# Patient Record
Sex: Female | Born: 1990 | Race: White | Hispanic: No | Marital: Single | Smoking: Current every day smoker
Health system: Southern US, Community
[De-identification: ages and names within clinical notes are randomized; demographics above are authoritative.]

## PROBLEM LIST (undated history)

## (undated) DIAGNOSIS — F319 Bipolar disorder, unspecified: Secondary | ICD-10-CM

---
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
## 2012-07-18 IMAGING — US ABDOMEN ULTRASOUND
1 series · 17 of 25 positions shown · non-contrast
Comparison: none

REASON FOR EXAM: RLQ/pelvic pain
COMMENTS:

PROCEDURE:     US  - US ABDOMEN GENERAL SURVEY  - June 04, 2011  [DATE]
RESULT:     Comparison: None.
TECHNIQUE: Multiple grayscale and color Doppler images were obtained of the
abdomen.

[Series 1: abdomen ultrasound · 17 of 84 slices shown]
[im 1/84]
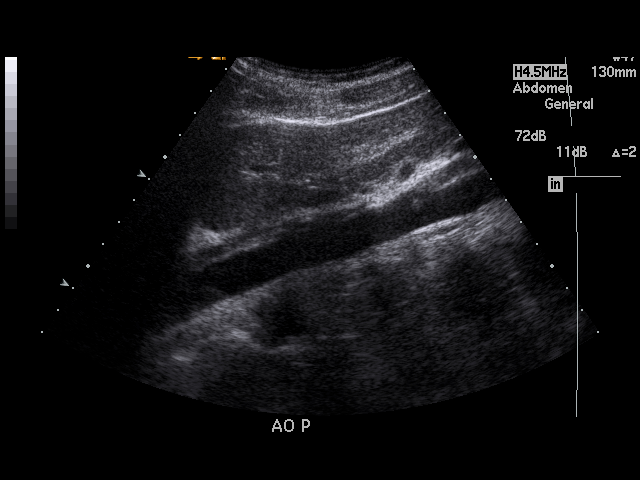
[im 7/84]
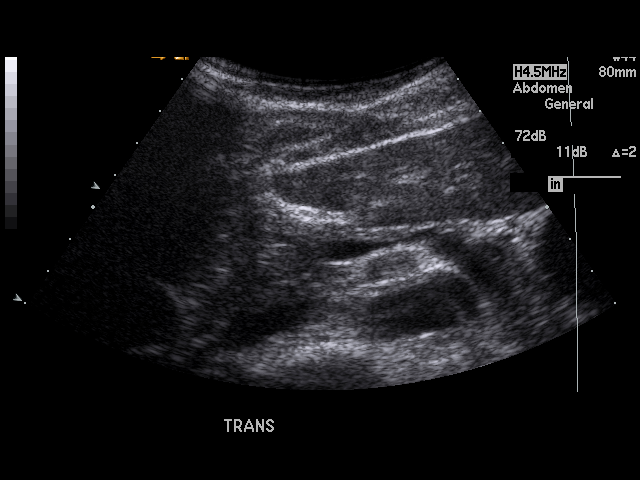
[im 11/84]
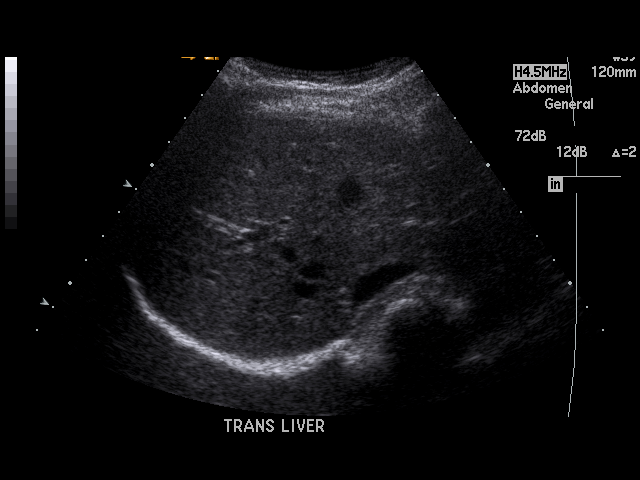
[im 18/84]
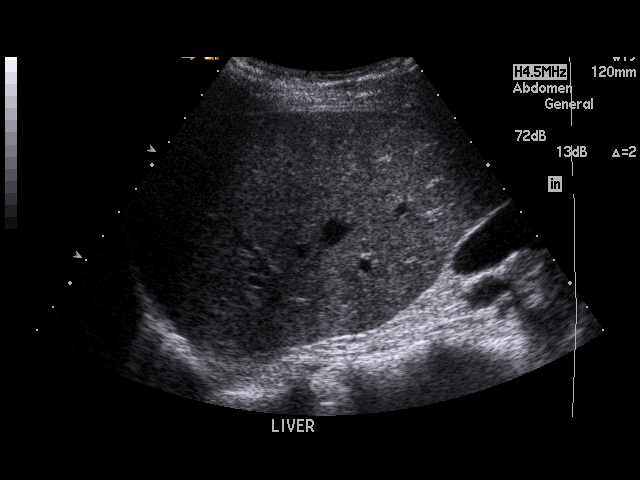
[im 21/84]
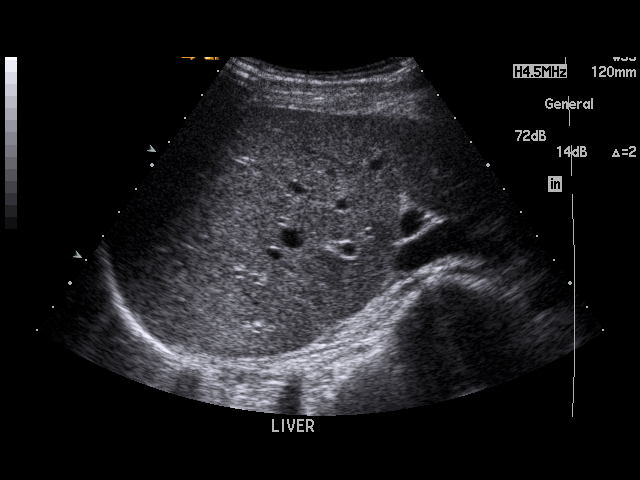
[im 28/84]
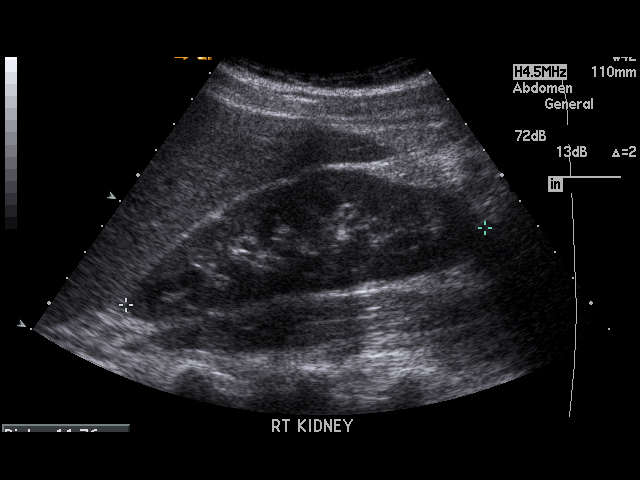
[im 32/84]
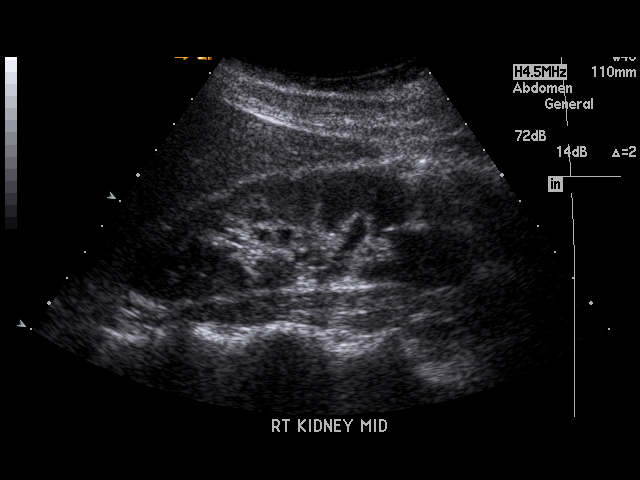
[im 39/84]
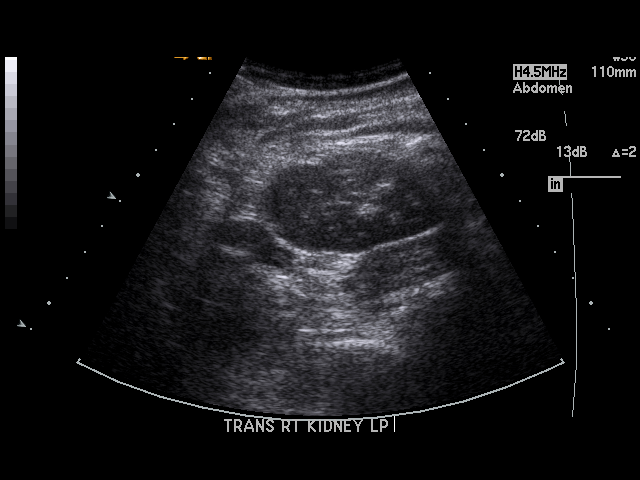
[im 42/84]
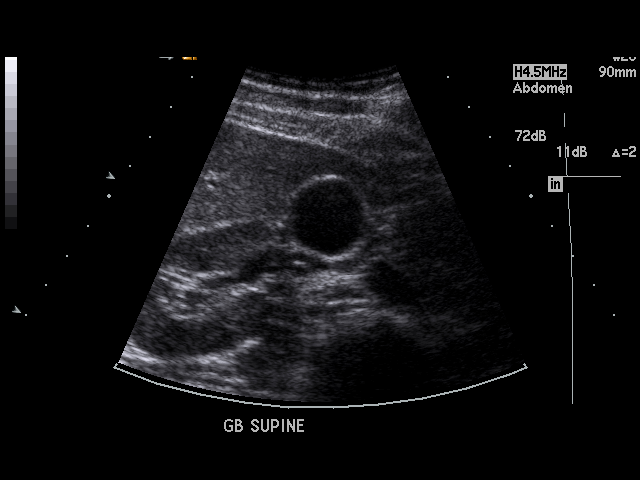
[im 45/84]
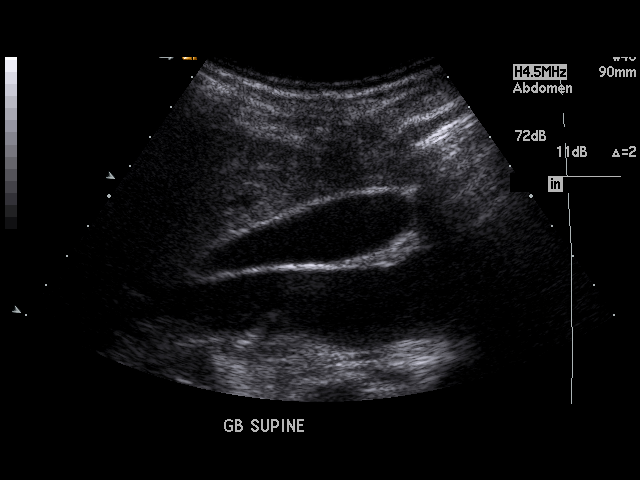
[im 52/84]
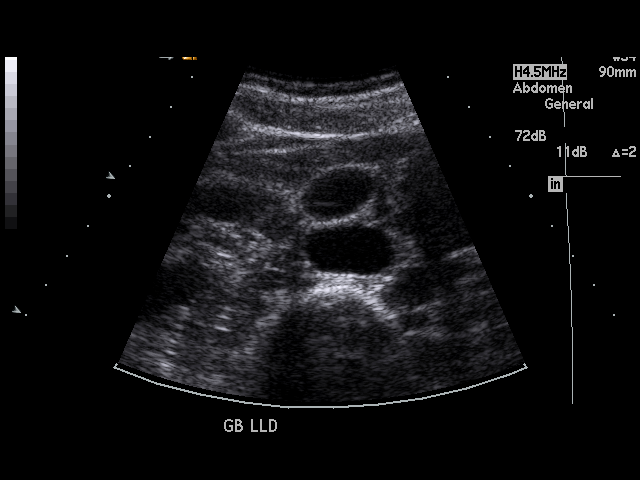
[im 56/84]
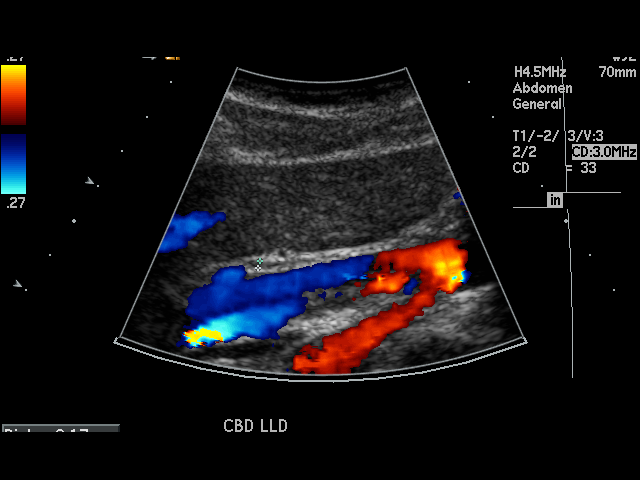
[im 63/84]
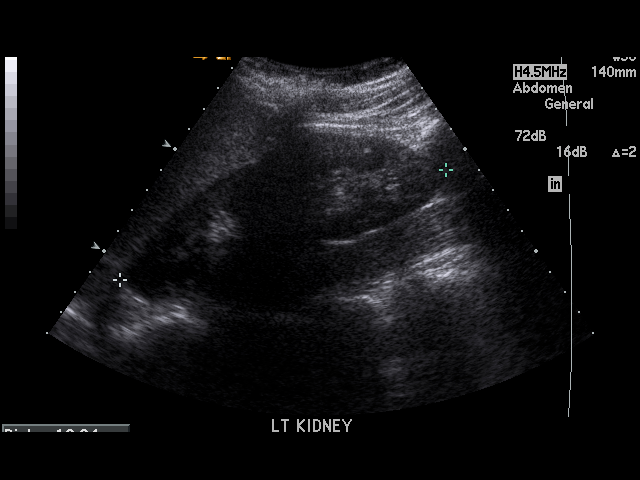
[im 66/84]
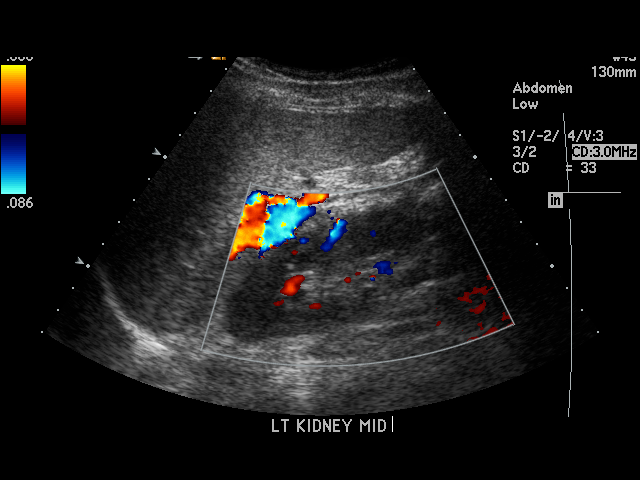
[im 73/84]
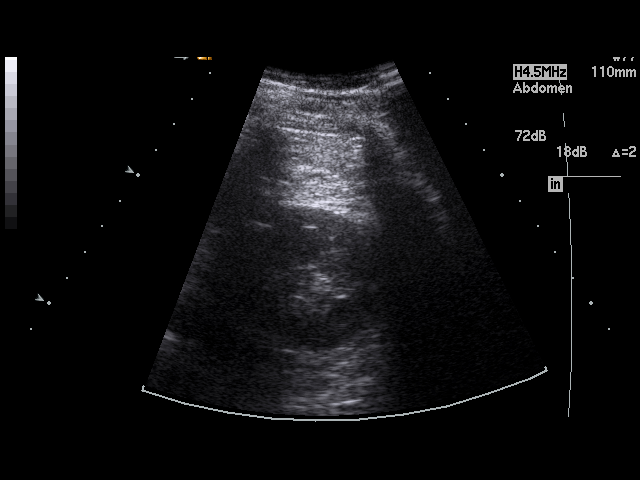
[im 77/84]
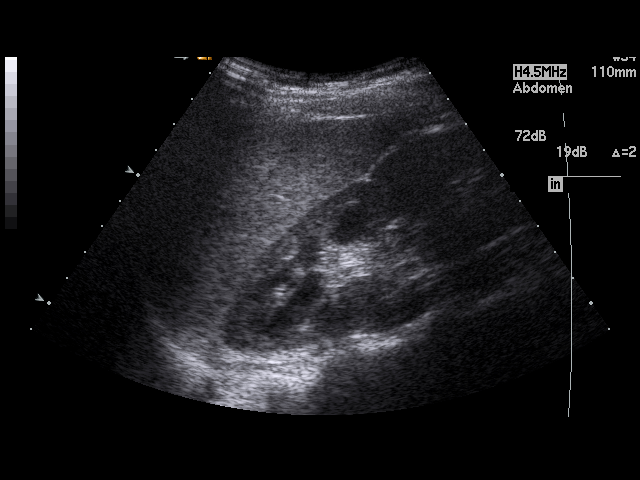
[im 84/84]
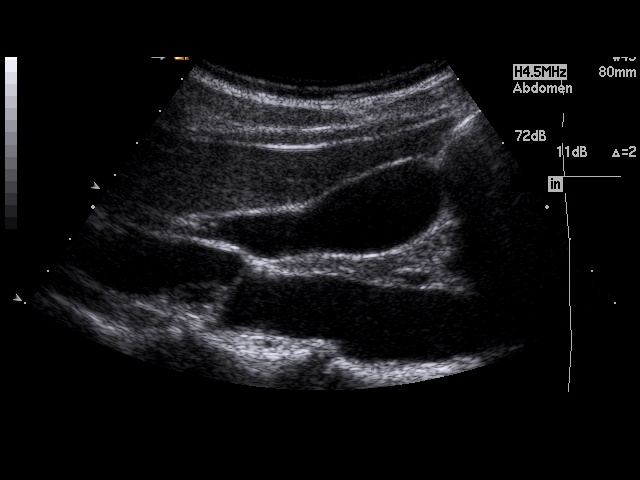

[17 of 25 positions shown; findings below may reference images not displayed]

FINDINGS: The visualized liver, pancreas, and spleen are unremarkable. The gallbladder
is normal. Sonographic Murphy sign was negative. The common bile duct
measures 2 mm.

Images of the kidneys showed no hydronephrosis.
IMPRESSION: No acute findings. Normal gallbladder.

## 2016-04-08 ENCOUNTER — Encounter (HOSPITAL_COMMUNITY): Payer: Self-pay

## 2016-04-08 ENCOUNTER — Emergency Department (HOSPITAL_COMMUNITY)
Admission: EM | Admit: 2016-04-08 | Discharge: 2016-04-09 | Disposition: A | Discharge status: Other Acute Inpatient Hospital | Payer: BC Managed Care – PPO | Attending: Emergency Medicine | Admitting: Emergency Medicine

## 2016-04-08 DIAGNOSIS — F17200 Nicotine dependence, unspecified, uncomplicated: Secondary | ICD-10-CM | POA: Insufficient documentation

## 2016-04-08 DIAGNOSIS — R45851 Suicidal ideations: Secondary | ICD-10-CM

## 2016-04-08 DIAGNOSIS — F10230 Alcohol dependence with withdrawal, uncomplicated: Secondary | ICD-10-CM | POA: Diagnosis not present

## 2016-04-08 DIAGNOSIS — F314 Bipolar disorder, current episode depressed, severe, without psychotic features: Secondary | ICD-10-CM | POA: Diagnosis present

## 2016-04-08 DIAGNOSIS — F315 Bipolar disorder, current episode depressed, severe, with psychotic features: Secondary | ICD-10-CM | POA: Diagnosis not present

## 2016-04-08 DIAGNOSIS — Z79899 Other long term (current) drug therapy: Secondary | ICD-10-CM | POA: Diagnosis not present

## 2016-04-08 DIAGNOSIS — T391X2A Poisoning by 4-Aminophenol derivatives, intentional self-harm, initial encounter: Secondary | ICD-10-CM | POA: Insufficient documentation

## 2016-04-08 HISTORY — DX: Bipolar disorder, unspecified: F31.9

## 2016-04-08 LAB — COMPREHENSIVE METABOLIC PANEL
ALBUMIN: 3.8 g/dL (ref 3.5–5.0)
ALT: 15 U/L (ref 14–54)
ALT: 17 U/L (ref 14–54)
AST: 16 U/L (ref 15–41)
AST: 19 U/L (ref 15–41)
Albumin: 3.8 g/dL (ref 3.5–5.0)
Alkaline Phosphatase: 26 U/L — ABNORMAL LOW (ref 38–126)
Alkaline Phosphatase: 24 U/L — ABNORMAL LOW (ref 38–126)
Anion gap: 10 (ref 5–15)
Anion gap: 11 (ref 5–15)
BUN: 11 mg/dL (ref 6–20)
BUN: 6 mg/dL (ref 6–20)
CHLORIDE: 111 mmol/L (ref 101–111)
CHLORIDE: 108 mmol/L (ref 101–111)
CO2: 17 mmol/L — AB (ref 22–32)
CO2: 20 mmol/L — AB (ref 22–32)
CREATININE: 0.8 mg/dL (ref 0.44–1.00)
Calcium: 8.3 mg/dL — ABNORMAL LOW (ref 8.9–10.3)
Calcium: 7.7 mg/dL — ABNORMAL LOW (ref 8.9–10.3)
Creatinine, Ser: 0.74 mg/dL (ref 0.44–1.00)
GFR calc Af Amer: >60 mL/min (ref >60)
GFR calc non Af Amer: >60 mL/min (ref >60)
GLUCOSE: 117 mg/dL — AB (ref 65–99)
Glucose, Bld: 114 mg/dL — ABNORMAL HIGH (ref 65–99)
POTASSIUM: 3.1 mmol/L — AB (ref 3.5–5.1)
POTASSIUM: 3.7 mmol/L (ref 3.5–5.1)
SODIUM: 138 mmol/L (ref 135–145)
Sodium: 139 mmol/L (ref 135–145)
Total Bilirubin: 0.3 mg/dL (ref 0.3–1.2)
Total Bilirubin: 0.4 mg/dL (ref 0.3–1.2)
Total Protein: 6.9 g/dL (ref 6.5–8.1)
Total Protein: 7.5 g/dL (ref 6.5–8.1)

## 2016-04-08 LAB — ACETAMINOPHEN LEVEL
Acetaminophen (Tylenol), Serum: 36 ug/mL — ABNORMAL HIGH (ref 10–30)
Acetaminophen (Tylenol), Serum: <10 ug/mL — ABNORMAL LOW (ref 10–30)

## 2016-04-08 LAB — CBC
HEMATOCRIT: 33 % — AB (ref 36.0–46.0)
HEMOGLOBIN: 10.9 g/dL — AB (ref 12.0–15.0)
MCH: 31 pg (ref 26.0–34.0)
MCHC: 33 g/dL (ref 30.0–36.0)
MCV: 93.8 fL (ref 78.0–100.0)
Platelets: 326 10*3/uL (ref 150–400)
RBC: 3.52 MIL/uL — AB (ref 3.87–5.11)
RDW: 13.2 % (ref 11.5–15.5)
WBC: 7.8 10*3/uL (ref 4.0–10.5)

## 2016-04-08 LAB — POC URINE PREG, ED: PREG TEST UR: NEGATIVE

## 2016-04-08 LAB — RAPID URINE DRUG SCREEN, HOSP PERFORMED
AMPHETAMINES: NOT DETECTED
BARBITURATES: NOT DETECTED
BENZODIAZEPINES: NOT DETECTED
COCAINE: NOT DETECTED
Opiates: NOT DETECTED
TETRAHYDROCANNABINOL: NOT DETECTED

## 2016-04-08 LAB — ETHANOL: ALCOHOL ETHYL (B): 95 mg/dL — AB (ref <5)

## 2016-04-08 LAB — SALICYLATE LEVEL

## 2016-04-08 MED ORDER — SODIUM CHLORIDE 0.9 % IV BOLUS (SEPSIS)
1000.0000 mL | Freq: Once | INTRAVENOUS | Status: AC
  Administered 2016-04-08: 1000 mL via INTRAVENOUS

## 2016-04-08 MED ORDER — HYDROXYZINE HCL 25 MG PO TABS
25.0000 mg | ORAL_TABLET | Freq: Three times a day (TID) | ORAL | Status: DC | PRN
  Administered 2016-04-08 – 2016-04-09 (×2): 25 mg via ORAL
  Filled 2016-04-08 (×2): qty 1

## 2016-04-08 MED ORDER — TRAZODONE HCL 50 MG PO TABS
50.0000 mg | ORAL_TABLET | Freq: Every evening | ORAL | Status: DC | PRN
  Administered 2016-04-08: 50 mg via ORAL
  Filled 2016-04-08: qty 1

## 2016-04-08 MED ORDER — DEXTROSE 5 % IV SOLN
15.0000 mg/kg/h | INTRAVENOUS | Status: DC
  Administered 2016-04-08: 15 mg/kg/h via INTRAVENOUS
  Filled 2016-04-08: qty 200

## 2016-04-08 MED ORDER — ONDANSETRON HCL 4 MG/2ML IJ SOLN
4.0000 mg | Freq: Once | INTRAMUSCULAR | Status: AC
  Administered 2016-04-08: 4 mg via INTRAVENOUS
  Filled 2016-04-08: qty 2

## 2016-04-08 MED ORDER — ACETYLCYSTEINE LOAD VIA INFUSION
150.0000 mg/kg | Freq: Once | INTRAVENOUS | Status: AC
  Administered 2016-04-08: 10005 mg via INTRAVENOUS
  Filled 2016-04-08: qty 251

## 2016-04-08 NOTE — ED Notes (Signed)
Bed: RESB Expected date:  Expected time:  Means of arrival:  Comments: EMS SI/intoxicated

## 2016-04-08 NOTE — ED Notes (Signed)
Patient noted in room. Medication order obtained for previous c/o. Pt. stable, in no acute distress. Q15 minute rounds and monitoring via [employer] to continue.

## 2016-04-08 NOTE — ED Provider Notes (Signed)
I assumed care of this patient from Dr. Rhunette CroftNanavati at 0700.  Please see their note for further details of Hx, PE.  Briefly patient is a 25 y.o. female with a IVC; Suicidal; and Alcohol Intoxication  that is concerning for possible acetaminophen toxicity. Patient was started on NAC due to mildly elevated acetaminophen level.  Current plan is to follow-up repeat acetaminophen level and CMP. These are within normal limits NAC can be discontinued.  Repeat acetaminophen level negative and CMP without evidence of renal failure. Patient was reassessed and clinically sober. Medically cleared for behavioral health evaluation.     Veronica ConnPedro Eduardo Norm Wray, MD 04/08/16 [phone]

## 2016-04-08 NOTE — ED Notes (Signed)
Patient noted sleeping in room. No complaints, stable, in no acute distress. Q15 minute rounds and monitoring via [employer] Cam[employer] to continue.  

## 2016-04-08 NOTE — ED Notes (Signed)
Pt's mom says that she has attempted suicide three other times, she does have superficial lacerations to her wrists and she has stated that she doesn't want to live and having dark thoughts to her mom and friend.

## 2016-04-08 NOTE — ED Notes (Signed)
Patient noted in room. C/O anxiety and insomnia. Pt. stable, in no acute distress. Q15 minute rounds and monitoring via [employer] to continue. Snack and beverage given.

## 2016-04-08 NOTE — ED Notes (Signed)
Pt oriented to room and unit.  Pt is very tearful and withdrawn.  Pt contracts for safety.  15 minute checks and video monitoring in place.

## 2016-04-08 NOTE — ED Notes (Signed)
Bed: WA15 Expected date:  Expected time:  Means of arrival:  Comments: Res b 

## 2016-04-08 NOTE — BH Assessment (Signed)
[facility] Assessment Progress Note   Case was staffed with Lord DNP who recommended an inpatient admission as appropriate bed placement is investigated.     

## 2016-04-08 NOTE — ED Notes (Signed)
Pt has been drinking heavily tonight and per mom on scene only took one of her lamictal pills, pt is bipolar but doesn't have a doctor but is suppose to be getting a new doctor soon. There is also some boyfriend drama. Mom is taking out IVC papers, pt admitted to [employer] that she was suicidal.

## 2016-04-08 NOTE — Progress Notes (Signed)
MEDICATION RELATED CONSULT NOTE - INITIAL   Pharmacy Consult for Acetadote Indication: Nyquil and Lamictal OD  Allergies  Allergen Reactions  . Dimetapp Cold-Allergy [Brompheniramine-Phenylephrine] Other (See Comments)    Caused confusion    Patient Measurements: Height: 5\' 7"  (170.2 cm) Weight: 147 lb (66.7 kg) IBW/kg (Calculated) : 61.6   Vital Signs: Temp: 98.3 F (36.8 C) (11/17 0214) Temp Source: Oral (11/17 0214) BP: 134/86 (11/17 0235) Pulse Rate: 101 (11/17 0235) Intake/Output from previous day: No intake/output data recorded. Intake/Output from this shift: No intake/output data recorded.  Labs:  Recent Labs  04/08/16 0219  WBC 7.8  HGB 10.9*  HCT 33.0*  PLT 326  CREATININE 0.80  ALBUMIN 3.8  PROT 6.9  AST 16  ALT 15  ALKPHOS 26*  BILITOT 0.3   Estimated Creatinine Clearance: 106.4 mL/min (by C-G formula based on SCr of 0.8 mg/dL).   Microbiology: No results found for this or any previous visit (from the past 720 hour(s)).  Medical History: Past Medical History:  Diagnosis Date  . Bipolar 1 disorder (HCC)     Medications:  Scheduled:  . acetylcysteine  150 mg/kg Intravenous Once   Infusions:  . acetylcysteine    . sodium chloride      Assessment: 25 yoF drank 1/2 bottle of nyquil and took all of her lamictal tablets.  PC was contacted and they recommened checking apap level which was elevated at 36.   Acetadote per Rx. Today, 11/17 -AST/ALT=16/15, Apap=36  Goal of Therapy:  Prevent liver damage  Plan:  Acetadote 150 mg/kg x1 over 1 hr Then 15 mg/kg/hr until PC recommends stopping F/u Apap/ALT/AST/INR  Lorenza EvangelistGreen, Loretha Ure R 04/08/2016,4:33 AM

## 2016-04-08 NOTE — ED Notes (Signed)
Poison Control contact spoke with Onalee HuaDavid who made recommendations to obtain Acetaminophen level and treat as appropriate and to obtain a EKG.  Expect pt to have N/V/ Drowsiness. Slurred speech, tachycardia, and possibly SZ determine on amount ingested.

## 2016-04-08 NOTE — ED Notes (Signed)
[phone] Stanley- Lisa mother Call if TTS or MD has any questions
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
## 2016-04-08 NOTE — ED Provider Notes (Signed)
[facility]-EMERGENCY DEPT Provider Note   CSN: [id number] Arrival date & time: 04/08/16  9528  By signing my name below, I, Soijett Blue, attest that this documentation has been prepared under the direction and in the presence of Derwood Kaplan, MD. Electronically Signed: Soijett Blue, ED Scribe. 04/08/16. 3:50 AM.  History   Chief Complaint Chief Complaint  Patient presents with  . IVC  . Suicidal  . Alcohol Intoxication    LEVEL 5 CAVEAT: ALTERED MENTAL STATUS/SLEEPINESS  HPI Veronica Dawson is a 25 y.o. female with a PMHx of bipolar 1 disorder, who presents to the Emergency Department complaining of suicidal onset 11 PM yesterday night. Mother notes that she believed that the pt was attempting to commit suicide tonight due to the pt sending her mother a "goodbye" text tonight. Mother states that the pt has had recent stressors of financial debt and issues with her boyfriend. Mother notes that the pt drank half a bottle of nyquil along with taking all of her 100 mg lamictal. Pt takes lamictal and depakote for bipolar 1 disorder and the pt stopped taking depakote this past week without doctors orders. Mother denies the pt taking any other medications at this time. Mother denies the pt having any other symptoms at this time.   The history is provided by the EMS personnel and a parent. No language interpreter was used.    Past Medical History:  Diagnosis Date  . Bipolar 1 disorder (HCC)     There are no active problems to display for this patient.   History reviewed. No pertinent surgical history.  OB History    No data available       Home Medications    Prior to Admission medications   Medication Sig Start Date End Date Taking? Authorizing Provider  lamoTRIgine (LAMICTAL) 100 MG tablet Take 100 mg by mouth daily.   Yes Historical Provider, MD  Multiple Vitamin (MULTIVITAMIN WITH MINERALS) TABS tablet Take 1 tablet by mouth daily.   Yes Historical Provider, MD    norethindrone-ethinyl estradiol (JUNEL FE,GILDESS FE,LOESTRIN FE) 1-20 MG-MCG tablet Take 1 tablet by mouth daily.   Yes Historical Provider, MD    Family History History reviewed. No pertinent family history.  Social History Social History  Substance Use Topics  . Smoking status: Current Every Day Smoker  . Smokeless tobacco: Never Used  . Alcohol use Yes     Allergies   Dimetapp cold-allergy [brompheniramine-phenylephrine]   Review of Systems Review of Systems  Reason unable to perform ROS: altered mental status change/sleepiness.     Physical Exam Updated Vital Signs BP 129/74 (BP Location: Left Arm)   Pulse 78   Temp 98.4 F (36.9 C) (Oral)   Resp 18   Ht 5\' 7"  (1.702 m)   Wt 147 lb (66.7 kg)   SpO2 99%   BMI 23.02 kg/m   Physical Exam  Constitutional: She is oriented to person, place, and time. She appears well-developed and well-nourished. No distress.  HENT:  Head: Normocephalic and atraumatic.  Eyes: EOM are normal. Pupils are equal, round, and reactive to light. Right eye exhibits no nystagmus. Left eye exhibits no nystagmus.  Pupils are 3 mm and equal. PERRL.   Neck: Neck supple.  Cardiovascular: Regular rhythm and normal heart sounds.  Tachycardia present.  Exam reveals no gallop and no friction rub.   No murmur heard. Pulmonary/Chest: Effort normal and breath sounds normal. No respiratory distress. She has no wheezes. She has no rales.  Abdominal: Soft.  She exhibits no distension. There is no tenderness.  Musculoskeletal: Normal range of motion.  Neurological: She is alert and oriented to person, place, and time.  Skin: Skin is warm and dry.  Psychiatric: She has a normal mood and affect. Her behavior is normal.  Nursing note and vitals reviewed.    ED Treatments / Results  DIAGNOSTIC STUDIES: Oxygen Saturation is 97% on Ra, nl by my interpretation.    COORDINATION OF CARE: 3:45 AM Discussed treatment plan with pt at bedside which includes  labs, UDS, consult to TTS, and pt agreed to plan.   Labs (all labs ordered are listed, but only abnormal results are displayed) Labs Reviewed  COMPREHENSIVE METABOLIC PANEL - Abnormal; Notable for the following:       Result Value   Potassium 3.1 (*)    CO2 17 (*)    Glucose, Bld 114 (*)    Calcium 8.3 (*)    Alkaline Phosphatase 26 (*)    All other components within normal limits  ETHANOL - Abnormal; Notable for the following:    Alcohol, Ethyl (B) 95 (*)    All other components within normal limits  CBC - Abnormal; Notable for the following:    RBC 3.52 (*)    Hemoglobin 10.9 (*)    HCT 33.0 (*)    All other components within normal limits  ACETAMINOPHEN LEVEL - Abnormal; Notable for the following:    Acetaminophen (Tylenol), Serum 36 (*)    All other components within normal limits  ACETAMINOPHEN LEVEL - Abnormal; Notable for the following:    Acetaminophen (Tylenol), Serum <10 (*)    All other components within normal limits  COMPREHENSIVE METABOLIC PANEL - Abnormal; Notable for the following:    CO2 20 (*)    Glucose, Bld 117 (*)    Calcium 7.7 (*)    Alkaline Phosphatase 24 (*)    All other components within normal limits  RAPID URINE DRUG SCREEN, HOSP PERFORMED  SALICYLATE LEVEL  POC URINE PREG, ED    Procedures Procedures (including critical care time)  Medications Ordered in ED Medications  acetylcysteine (ACETADOTE) 40 mg/mL load via infusion 10,005 mg (10,005 mg Intravenous Bolus from Bag 04/08/16 0452)    Followed by  acetylcysteine (ACETADOTE) 40,000 mg in dextrose 5 % 1,000 mL (40 mg/mL) infusion (0 mg/kg/hr  66.7 kg Intravenous Stopped 04/08/16 1224)  hydrOXYzine (ATARAX/VISTARIL) tablet 25 mg (25 mg Oral Given 04/08/16 2120)  traZODone (DESYREL) tablet 50 mg (50 mg Oral Given 04/08/16 2120)  sodium chloride 0.9 % bolus 1,000 mL (0 mLs Intravenous Stopped 04/08/16 1112)  ondansetron (ZOFRAN) injection 4 mg (4 mg Intravenous Given 04/08/16 1224)      Initial Impression / Assessment and Plan / ED Course  I have reviewed the triage vital signs and the nursing notes.  Pertinent labs that were available during my care of the patient were reviewed by me and considered in my medical decision making (see chart for details).  Clinical Course     I personally performed the services described in this documentation, which was scribed in my presence. The recorded information has been reviewed and is accurate.   Final Clinical Impressions(s) / ED Diagnoses   Final diagnoses:  Suicidal ideation  Intentional acetaminophen overdose, initial encounter [facility](HCC)    New Prescriptions New Prescriptions   No medications on file   I personally performed the services described in this documentation, which was scribed in my presence. The recorded information has been reviewed and  is accurate.   Pt comes in with cc of SI. She overdosed on multiple meds and is intoxicated. Unknown time of Nyquil ingestion. Mother repots that she got a text message from daughter at 2711, and daughter had already overdosed at that time, so we presume the ingestion time to be 10 pm. Daughter not very helpful with history. Tylenol level is high - we will start Nacetyl cysteine. Repeat level ordered for 10 am, if improving, the mucomyst can be discontinued, and pt can be medically cleared.  CRITICAL CARE Performed by: Derwood KaplanNanavati, Alonza Knisley   Total critical care time: 32  Minutes - tylenol overdose  Critical care time was exclusive of separately billable procedures and treating other patients.  Critical care was necessary to treat or prevent imminent or life-threatening deterioration.  Critical care was time spent personally by me on the following activities: development of treatment plan with patient and/or surrogate as well as nursing, discussions with consultants, evaluation of patient's response to treatment, examination of patient, obtaining history from patient or  surrogate, ordering and performing treatments and interventions, ordering and review of laboratory studies, ordering and review of radiographic studies, pulse oximetry and re-evaluation of patient's condition.      Derwood KaplanAnkit Brittanyann Wittner, MD 04/08/16 [phone]

## 2016-04-08 NOTE — ED Notes (Signed)
Report to include Situation, Background, Assessment, and Recommendations received from Edie Marvin RN. Patient alert and oriented, warm and dry, in no acute distress. Patient denies SI, HI, AVH and pain. Patient made aware of Q15 minute rounds and security cameras for their safety. Patient instructed to come to me with needs or concerns. 

## 2016-04-09 ENCOUNTER — Encounter (HOSPITAL_COMMUNITY): Payer: Self-pay | Admitting: *Deleted

## 2016-04-09 ENCOUNTER — Inpatient Hospital Stay (HOSPITAL_COMMUNITY)
Admission: AD | Admit: 2016-04-09 | Discharge: 2016-04-13 | DRG: 885 | Disposition: A | Discharge status: Home / Self Care | Payer: BC Managed Care – PPO | Attending: Psychiatry | Admitting: Psychiatry

## 2016-04-09 DIAGNOSIS — F17210 Nicotine dependence, cigarettes, uncomplicated: Secondary | ICD-10-CM | POA: Diagnosis not present

## 2016-04-09 DIAGNOSIS — F10230 Alcohol dependence with withdrawal, uncomplicated: Secondary | ICD-10-CM | POA: Diagnosis present

## 2016-04-09 DIAGNOSIS — Z888 Allergy status to other drugs, medicaments and biological substances status: Secondary | ICD-10-CM

## 2016-04-09 DIAGNOSIS — F314 Bipolar disorder, current episode depressed, severe, without psychotic features: Secondary | ICD-10-CM | POA: Diagnosis present

## 2016-04-09 DIAGNOSIS — Z915 Personal history of self-harm: Secondary | ICD-10-CM

## 2016-04-09 DIAGNOSIS — F17200 Nicotine dependence, unspecified, uncomplicated: Secondary | ICD-10-CM | POA: Diagnosis present

## 2016-04-09 DIAGNOSIS — Z79899 Other long term (current) drug therapy: Secondary | ICD-10-CM

## 2016-04-09 DIAGNOSIS — F419 Anxiety disorder, unspecified: Secondary | ICD-10-CM | POA: Diagnosis present

## 2016-04-09 DIAGNOSIS — Z62810 Personal history of physical and sexual abuse in childhood: Secondary | ICD-10-CM | POA: Diagnosis present

## 2016-04-09 DIAGNOSIS — F319 Bipolar disorder, unspecified: Secondary | ICD-10-CM | POA: Diagnosis present

## 2016-04-09 DIAGNOSIS — R45851 Suicidal ideations: Secondary | ICD-10-CM

## 2016-04-09 DIAGNOSIS — F4310 Post-traumatic stress disorder, unspecified: Secondary | ICD-10-CM | POA: Diagnosis present

## 2016-04-09 DIAGNOSIS — Z793 Long term (current) use of hormonal contraceptives: Secondary | ICD-10-CM

## 2016-04-09 DIAGNOSIS — G4700 Insomnia, unspecified: Secondary | ICD-10-CM | POA: Diagnosis present

## 2016-04-09 DIAGNOSIS — F3130 Bipolar disorder, current episode depressed, mild or moderate severity, unspecified: Secondary | ICD-10-CM | POA: Diagnosis not present

## 2016-04-09 DIAGNOSIS — Z811 Family history of alcohol abuse and dependence: Secondary | ICD-10-CM | POA: Diagnosis not present

## 2016-04-09 DIAGNOSIS — T391X2A Poisoning by 4-Aminophenol derivatives, intentional self-harm, initial encounter: Secondary | ICD-10-CM | POA: Diagnosis not present

## 2016-04-09 MED ORDER — LORAZEPAM 1 MG PO TABS: 0.0000 mg | ORAL_TABLET | Freq: Two times a day (BID) | ORAL | Status: DC

## 2016-04-09 MED ORDER — HYDROXYZINE HCL 25 MG PO TABS: 50.0000 mg | ORAL_TABLET | Freq: Three times a day (TID) | ORAL | Status: DC | PRN

## 2016-04-09 MED ORDER — HYDROXYZINE HCL 50 MG PO TABS
50.0000 mg | ORAL_TABLET | Freq: Three times a day (TID) | ORAL | Status: DC | PRN
  Administered 2016-04-10 – 2016-04-12 (×7): 50 mg via ORAL
  Filled 2016-04-09 (×7): qty 1

## 2016-04-09 MED ORDER — MAGNESIUM HYDROXIDE 400 MG/5ML PO SUSP: 30.0000 mL | Freq: Every day | ORAL | Status: DC | PRN

## 2016-04-09 MED ORDER — LORAZEPAM 1 MG PO TABS
0.0000 mg | ORAL_TABLET | Freq: Four times a day (QID) | ORAL | Status: DC
Start: 2016-04-09 — End: 2016-04-09

## 2016-04-09 MED ORDER — VITAMIN B-1 100 MG PO TABS
100.0000 mg | ORAL_TABLET | Freq: Every day | ORAL | Status: DC
  Filled 2016-04-09: qty 1

## 2016-04-09 MED ORDER — LAMOTRIGINE 25 MG PO TABS
25.0000 mg | ORAL_TABLET | Freq: Every day | ORAL | Status: DC
  Filled 2016-04-09 (×5): qty 1

## 2016-04-09 MED ORDER — ALUM & MAG HYDROXIDE-SIMETH 200-200-20 MG/5ML PO SUSP: 30.0000 mL | ORAL | Status: DC | PRN

## 2016-04-09 MED ORDER — TRAZODONE HCL 50 MG PO TABS
50.0000 mg | ORAL_TABLET | Freq: Every evening | ORAL | Status: DC | PRN
  Administered 2016-04-09 – 2016-04-12 (×5): 50 mg via ORAL
  Filled 2016-04-09 (×13): qty 1

## 2016-04-09 MED ORDER — LAMOTRIGINE 100 MG PO TABS
100.0000 mg | ORAL_TABLET | Freq: Every day | ORAL | Status: DC
  Administered 2016-04-09: 100 mg via ORAL
  Filled 2016-04-09 (×6): qty 1

## 2016-04-09 MED ORDER — LORAZEPAM 2 MG/ML IJ SOLN: 1.0000 mg | Freq: Four times a day (QID) | INTRAMUSCULAR | Status: DC | PRN

## 2016-04-09 MED ORDER — ACETAMINOPHEN 325 MG PO TABS: 650.0000 mg | ORAL_TABLET | Freq: Four times a day (QID) | ORAL | Status: DC | PRN

## 2016-04-09 MED ORDER — THIAMINE HCL 100 MG/ML IJ SOLN: 100.0000 mg | Freq: Every day | INTRAMUSCULAR | Status: DC

## 2016-04-09 MED ORDER — LORAZEPAM 1 MG PO TABS: 1.0000 mg | ORAL_TABLET | Freq: Four times a day (QID) | ORAL | Status: DC | PRN

## 2016-04-09 MED ORDER — FOLIC ACID 1 MG PO TABS
1.0000 mg | ORAL_TABLET | Freq: Every day | ORAL | Status: DC
  Administered 2016-04-09: 1 mg via ORAL
  Filled 2016-04-09: qty 1

## 2016-04-09 MED ORDER — ADULT MULTIVITAMIN W/MINERALS CH
1.0000 | ORAL_TABLET | Freq: Every day | ORAL | Status: DC
  Administered 2016-04-09: 1 via ORAL
  Filled 2016-04-09: qty 1

## 2016-04-09 MED ORDER — NORETHIN ACE-ETH ESTRAD-FE 1-20 MG-MCG PO TABS
1.0000 | ORAL_TABLET | Freq: Every day | ORAL | Status: DC
  Administered 2016-04-10: 1 via ORAL
  Filled 2016-04-09 (×3): qty 1

## 2016-04-09 MED ORDER — BUPROPION HCL ER (XL) 150 MG PO TB24
150.0000 mg | ORAL_TABLET | Freq: Every day | ORAL | Status: DC
  Filled 2016-04-09 (×5): qty 1

## 2016-04-09 NOTE — ED Notes (Signed)
Patient noted sleeping in room. No complaints, stable, in no acute distress. Q15 minute rounds and monitoring via [employer] Cam[employer] to continue.  

## 2016-04-09 NOTE — Progress Notes (Signed)
Patient ID: Veronica FavorJulie Dawson, female   DOB: 10/05/1990, 25 y.o.   MRN: [id number]   25 year old white female admitted after [facility] to a overdose on a bottle of Nyquil and unknown amount of Lamictal. Pt reported that she wanted to die after she found out that her boyfriend cheated on her. Pt reported that she was crushed and that she did not want to live amore. Pt reported that she recently lost her job and home, and had to move back in with her parents. Pt reported that she attempted to kill herself back in 2011, that she overdosed again. Pt reported that she has a history of being on Lamictal and that she is suppose to be taking everyday. Pt reported that she was positive SI, but was able to contract for safety. Pt reported that her depression was a 10, her hopelessness was a 10, and her anxiety was a 10. Pt reported that he was negative HI, no AH/VH noted.
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
## 2016-04-09 NOTE — BH Assessment (Signed)
[name], [facility], confirmed room 304-2 is available. Pt was accepted by Nanine MeansJamison Lord, NP to the service of Dr. Mckinley Jewelates. Notified Dr. Nicanor AlconPalumbo and Marita SnellenGary Leduc, RN of acceptance.   Harlin RainFord Ellis Patsy BaltimoreWarrick Jr, LPC, [facility], [facility] Triage Specialist [phone]

## 2016-04-09 NOTE — [facility] Group Notes (Signed)
Adult Psychoeducational Group Note  Date:  04/09/2016 Time:  10:29 PM  Group Topic/Focus:  Wrap-Up Group:   The focus of this group is to help patients review their daily goal of treatment and discuss progress on daily workbooks.   Participation Level:  Active  Participation Quality:  Appropriate  Affect:  Appropriate  Cognitive:  Alert  Insight: Appropriate  Engagement in Group:  Engaged  Modes of Intervention:  Discussion and Education  Additional Comments:  Patient identified her goal as working [facility].  Patient reported intending to work on eliminating negative thoughts and working on loving herself.    Elmore GuiseSLOAN, Lucianne Smestad N 04/09/2016, 10:29 PM

## 2016-04-09 NOTE — ED Notes (Signed)
Pt's father contacted and is aware that [facility]

## 2016-04-09 NOTE — ED Notes (Addendum)
[facility] w/ father and pt.  Pt reports that she does not drink daily and that until the other day she has not drank since halloween and does not feel that she has a problem with alcohol. Pt reports some improvement with her anxiety

## 2016-04-09 NOTE — B[facility] Group Notes (Signed)
[facility] Group Notes:  (Nursing/MHT/Case Management/Adjunct)  Date:  04/09/2016  Time:  2:30 PM  Type of Therapy:  Psychoeducational Skills  Participation Level:  Active  Participation Quality:  Appropriate  Affect:  Appropriate  Cognitive:  Appropriate  Insight:  Appropriate  Engagement in Group:  Engaged  Modes of Intervention:  Discussion  Summary of Progress/Problems: Pt did attend life skills group with the nurse.   Jacquelyne BalintForrest, Thara Searing Shanta 04/09/2016, 2:30 PM
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
## 2016-04-09 NOTE — Progress Notes (Signed)
Patient ID: Veronica Dawson, female   DOB: 11/22/1990, 25 y.o.   MRN: [id number]   EKG completed and place on the chart. Jacquelyne BalintShalita Jered Heiny RN

## 2016-04-09 NOTE — ED Notes (Addendum)
Pt is aware that she will transfer shortly,  contracts for safety and will contact her parents to bring additional clothing when she arrives.

## 2016-04-09 NOTE — [facility] Suicide Risk Assessment (Signed)
[facility]   Nursing information obtained from:  Patient Demographic factors:  Caucasian Current Mental Status:  Suicidal ideation indicated by patient Loss Factors:  Financial problems / change in socioeconomic status Historical Factors:  Prior suicide attempts Risk Reduction Factors:  Sense of responsibility to family  Total Time spent with patient: 45 minutes Principal Problem:  Bipolar Disorder, Depressed, PTSD  Diagnosis:   Patient Active Problem List   Diagnosis Date Noted  . Bipolar affective disorder, depressed, severe ([facility]) [F31.4] 04/09/2016  . Alcohol dependence with uncomplicated withdrawal ([facility]) [F10.230] 04/09/2016  . Bipolar affective disorder, current episode depressed ([facility]) [F31.30] 04/09/2016     Continued Clinical Symptoms:  Alcohol Use Disorder Identification Test Final Score (AUDIT): 1 The "Alcohol Use Disorders Identification Test", Guidelines for Use in Primary Care, Second Edition.  World Science writerHealth Organization [facility](WHO). Score between 0-7:  no or low risk or alcohol related problems. Score between 8-15:  moderate risk of alcohol related problems. Score between 16-19:  high risk of alcohol related problems. Score 20 or above:  warrants further diagnostic evaluation for alcohol dependence and treatment.   CLINICAL FACTORS:  25 year old single female, lives with mother, works as Leisure centre managerbartender, reports recent break up with BF , no legal issues . Status post suicide attempt by overdosing on Lamictal and Nyquil Reports history of chronic depression, short lived episodes of mood instability, PTSD . Has been diagnosed with Bipolar Disorder in the past      Musculoskeletal: Strength & Muscle Tone: within normal limits Gait & Station: normal Patient leans: N/A  Psychiatric Specialty Exam: Physical Exam  ROS  Blood pressure 116/71, pulse 80, temperature 98.9 F (37.2 C), temperature source Oral, resp. rate 17, height 5' 7.5" (1.715 m),  weight 145 lb (65.8 kg).Body mass index is 22.38 kg/m.  See admit note MSE    COGNITIVE FEATURES THAT CONTRIBUTE TO RISK:  Closed-mindedness and Loss of executive function    SUICIDE RISK:   Moderate:  Frequent suicidal ideation with limited intensity, and duration, some specificity in terms of plans, no associated intent, good self-control, limited dysphoria/symptomatology, some risk factors present, and identifiable protective factors, including available and accessible social support.   PLAN OF CARE: Patient will be admitted to inpatient psychiatric unit for stabilization and safety. Will provide and encourage milieu participation. Provide medication management and maked adjustments as needed.  Will follow daily.    I certify that inpatient services furnished can reasonably be expected to improve the patient's condition.  Nehemiah MassedOBOS, Dorthey Depace, MD 04/09/2016, 4:29 PM

## 2016-04-09 NOTE — ED Notes (Signed)
Pt's father into see, he is arrainging for [facility] to [facility]

## 2016-04-09 NOTE — Tx Team (Signed)
Initial Treatment Plan 04/09/2016 1:40 PM Veronica FavorJulie Dawson RUE:[id number]    PATIENT STRESSORS: Financial difficulties Marital or family conflict   PATIENT STRENGTHS: Ability for insight Average or above average intelligence   PATIENT IDENTIFIED PROBLEMS: "Overdosed on Nyquil and Lamictal"    History of SI, last in 2011    Depression             DISCHARGE CRITERIA:  Ability to meet basic life and health needs Need for constant or close observation no longer present  PRELIMINARY DISCHARGE PLAN: Return to previous living arrangement  PATIENT/FAMILY INVOLVEMENT: This treatment plan has been presented to and reviewed with the patient, Veronica FavorJulie Dawson, and/or family member.  The patient and family have been given the opportunity to ask questions and make suggestions.  Buford DresserForrest, Foday Cone Shanta, RN 04/09/2016, 1:40 PM

## 2016-04-09 NOTE — ED Notes (Signed)
Up to the bathroom 

## 2016-04-09 NOTE — ED Notes (Addendum)
Pt ambulatory w/o difficulty to [facility] with [facility], belongings given to officers. 

## 2016-04-10 LAB — LIPID PANEL
CHOL/HDL RATIO: 3.1 ratio
CHOLESTEROL: 200 mg/dL (ref 0–200)
HDL: 64 mg/dL (ref >40)
LDL Cholesterol: 120 mg/dL — ABNORMAL HIGH (ref 0–99)
TRIGLYCERIDES: 82 mg/dL (ref <150)
VLDL: 16 mg/dL (ref 0–40)

## 2016-04-10 LAB — TSH: TSH: 2.173 u[IU]/mL (ref 0.350–4.500)

## 2016-04-10 MED ORDER — TUBERCULIN PPD 5 UNIT/0.1ML ID SOLN
5.0000 [IU] | Freq: Once | INTRADERMAL | Status: AC
  Administered 2016-04-10: 5 [IU] via INTRADERMAL

## 2016-04-10 NOTE — Progress Notes (Signed)
Adult Psychoeducational Group Note  Date:  04/10/2016 Time:  10:41 PM  Group Topic/Focus:  Wrap-Up Group:   The focus of this group is to help patients review their daily goal of treatment and discuss progress on daily workbooks.   Participation Level:  Active  Participation Quality:  Appropriate  Affect:  Appropriate  Cognitive:  Appropriate  Insight: Appropriate and Good  Engagement in Group:  Improving  Modes of Intervention:  Discussion  Additional Comments:  Pt stated she had been tired all day, but it was an ok day Delos Haringhillips, Cass Edinger A 04/10/2016, 10:41 PM

## 2016-04-10 NOTE — Progress Notes (Signed)
[facility] MD Progress Note  04/10/2016 4:29 PM Avonne Berkery  MRN:  [id number] Subjective: Patient reports feeling depressed, anxious. States she has refused medications partly because she thinks she may be going to a Armed forces technical officer in [place] after discharge from unit and that she thinks they will not allow her to go if she is on any medications . Denies suicidal ideations. Objective : I have reviewed chart and met with patient . Patient presents depressed, sad, close to tears at times. She denies suicidal ideations . She denies any psychotic symptoms, and does not appear internally preoccupied . No disruptive or agitated behaviors, some milieu participation , but has tended to be isolative .  As noted, has not taken medications based on concerns that [facility] will not accept her if on standing medication regimen. No withdrawal symptoms, vitals are stable  Principal Problem: Bipolar affective disorder, current episode depressed ([place]) Diagnosis:   Patient Active Problem List   Diagnosis Date Noted  . Bipolar affective disorder, depressed, severe ([place]) [F31.4] 04/09/2016  . Alcohol dependence with uncomplicated withdrawal ([place]) [F10.230] 04/09/2016  . Bipolar affective disorder, current episode depressed ([place]) [F31.30] 04/09/2016   Total Time spent with patient: 20 minutes    Past Medical History:  Past Medical History:  Diagnosis Date  . Bipolar 1 disorder ([place])    History reviewed. No pertinent surgical history. Family History: History reviewed. No pertinent family history.  Social History:  History  Alcohol Use  . Yes     History  Drug use: Unknown    Social History   Social History  . Marital status: Single    Spouse name: N/A  . Number of children: N/A  . Years of education: N/A   Social History Main Topics  . Smoking status: Current Every Day Smoker  . Smokeless tobacco: Never Used  . Alcohol use Yes  . Drug use: Unknown  . Sexual activity: No    Other Topics Concern  . None   Social History Narrative  . None   Additional Social History:    Pain Medications: none Prescriptions: none Over the Counter: none History of alcohol / drug use?: No history of alcohol / drug abuse  Sleep: Fair  Appetite:  Fair  Current Medications: Current Facility-Administered Medications  Medication Dose Route Frequency Provider Last Rate Last Dose  . alum & mag hydroxide-simeth (MAALOX/MYLANTA) 200-200-20 MG/5ML suspension 30 mL  30 mL Oral Q4H PRN Patrecia Pour, NP      . buPROPion (WELLBUTRIN XL) 24 hr tablet 150 mg  150 mg Oral Daily Myer Peer Isabell Bonafede, MD      . hydrOXYzine (ATARAX/VISTARIL) tablet 50 mg  50 mg Oral TID PRN Patrecia Pour, NP   50 mg at 04/10/16 7893  . lamoTRIgine (LAMICTAL) tablet 100 mg  100 mg Oral Daily Patrecia Pour, NP   100 mg at 04/09/16 1729  . lamoTRIgine (LAMICTAL) tablet 25 mg  25 mg Oral QHS Orva Gwaltney A Tomasita Beevers, MD      . magnesium hydroxide (MILK OF MAGNESIA) suspension 30 mL  30 mL Oral Daily PRN Patrecia Pour, NP      . norethindrone-ethinyl estradiol (JUNEL FE,GILDESS FE,LOESTRIN FE) 1-20 MG-MCG per tablet 1 tablet  1 tablet Oral Daily Patrecia Pour, NP   1 tablet at 04/10/16 0900  . traZODone (DESYREL) tablet 50 mg  50 mg Oral QHS,MR X 1 Patrecia Pour, NP   50 mg at 04/09/16 2208  . tuberculin injection  5 Units  5 Units Intradermal Once Kerrie Buffalo, NP        Lab Results:  Results for orders placed or performed during the hospital encounter of 04/09/16 (from the past 48 hour(s))  Lipid panel     Status: Abnormal   Collection Time: 04/10/16  6:24 AM  Result Value Ref Range   Cholesterol 200 0 - 200 mg/dL   Triglycerides 82 <150 mg/dL   HDL 64 >40 mg/dL   Total CHOL/HDL Ratio 3.1 RATIO   VLDL 16 0 - 40 mg/dL   LDL Cholesterol 120 (H) 0 - 99 mg/dL    Comment:        Total Cholesterol/HDL:CHD Risk Coronary Heart Disease Risk Table                     Men   Women  1/2 Average Risk   3.4   3.3   Average Risk       5.0   4.4  2 X Average Risk   9.6   7.1  3 X Average Risk  23.4   11.0        Use the calculated Patient Ratio above and the CHD Risk Table to determine the patient's CHD Risk.        ATP III CLASSIFICATION (LDL):  <100     mg/dL   Optimal  100-129  mg/dL   Near or Above                    Optimal  130-159  mg/dL   Borderline  160-189  mg/dL   High  >190     mg/dL   Very High Performed at [facility]     Blood Alcohol level:  Lab Results  Component Value Date   ETH 95 (H) 70/05/7492    Metabolic Disorder Labs: No results found for: HGBA1C, MPG No results found for: PROLACTIN Lab Results  Component Value Date   CHOL 200 04/10/2016   TRIG 82 04/10/2016   HDL 64 04/10/2016   CHOLHDL 3.1 04/10/2016   VLDL 16 04/10/2016   LDLCALC 120 (H) 04/10/2016    Physical Findings: AIMS: Facial and Oral Movements Muscles of Facial Expression: None, normal Lips and Perioral Area: None, normal Jaw: None, normal Tongue: None, normal,Extremity Movements Upper (arms, wrists, hands, fingers): None, normal Lower (legs, knees, ankles, toes): None, normal, Trunk Movements Neck, shoulders, hips: None, normal, Overall Severity Severity of abnormal movements (highest score from questions above): None, normal Incapacitation due to abnormal movements: None, normal Patient's awareness of abnormal movements (rate only patient's report): No Awareness, Dental Status Current problems with teeth and/or dentures?: No Does patient usually wear dentures?: No  CIWA:  CIWA-Ar Total: 4 COWS:     Musculoskeletal: Strength & Muscle Tone: within normal limits no tremors, no diaphoresis Gait & Station: normal Patient leans: N/A  Psychiatric Specialty Exam: Physical Exam  ROS no headache, no chest pain, no shortness of breath, no vomiting   Blood pressure 110/72, pulse 73, temperature 98.3 F (36.8 C), temperature source Oral, resp. rate 20, height 5' 7.5" (1.715 m), weight  145 lb (65.8 kg).Body mass index is 22.38 kg/m.  General Appearance: Fairly Groomed  Eye Contact:  Good  Speech:  Normal Rate  Volume:  Decreased  Mood:  depressed, sad, anxious  Affect:  constricted   Thought Process:  Linear  Orientation:  Full (Time, Place, and Person)  Thought Content:  denies hallucinations, no delusions,  not internally preoccupied   Suicidal Thoughts:  No at this time denies any suicidal or self injurious ideations, no homicidal or violent ideations   Homicidal Thoughts:  No  Memory:  recent and remote grossly intact   Judgement:  Fair  Insight:  Fair  Psychomotor Activity:  Decreased   Concentration:  Concentration: Good and Attention Span: Good  Recall:  Good  Fund of Knowledge:  Good  Language:  Good  Akathisia:  Negative  Handed:  Right  AIMS (if indicated):     Assets:  Desire for Improvement Resilience  ADL's:  Intact  Cognition:  WNL  Sleep:  Number of Hours: 5.75   Assessment- patient remains depressed, sad, but denies suicidal ideations. Of concern, she states she realizes she would benefit from psychiatric medications but is reluctant to take because she feels [facility] will not take her if she is on medications.      Treatment Plan Summary: Daily contact with patient to assess and evaluate symptoms and progress in treatment, Medication management, Plan ongoing inpatient treatment  and medication management as below Encourage group and milieu participation to work on coping skills and symptom reduction Encouraged to continue Lamictal at 25 mgrs QAM and 100 mgrs QHS for depression, mood disorder Encouraged to continue Wellbutrin XL 150 mgrs QAM for depression Continue Traozodone 50 mgrs QHS PRN for insomnia as needed  Continue Vistaril 25 mgrs Q 6 hours PRN for anxiety as needed  Treatment team working on disposition planning  Patient will benefit from family meeting with mother, CSW, Probation officer- she agrees.  Neita Garnet, MD 04/10/2016,  4:29 PM
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
## 2016-04-10 NOTE — Progress Notes (Signed)
Patient has been up and active in the dayroom talking with select peers. She attended group this evening. She requested visteril for feeling anxious and reports feeling hyped. Writer encouraged her to try using some coping skills when having these feelings in case it may be too soon for another dose of visteril. She did go into the dayroom and interacted with peers until coming back later to request visteril. Patient currently denies having pain, -si/hi/a/v hall. Support and encouragement offered, safety maintained on unit, will continue to monitor.

## 2016-04-10 NOTE — B[facility] Suicide Risk Assessment (Signed)
[facility] Family/Significant Other Suicide Prevention Education  Suicide Prevention Education:   Patient Refusal for Family/Significant Other Suicide Prevention Education: The patient has refused to provide written consent for family/significant other to be provided Family/Significant Other Suicide Prevention Education during admission and/or prior to discharge.  Physician notified.  CSW provided suicide prevention information with patient.    The suicide prevention education provided includes the following:  Suicide risk factors  Suicide prevention and interventions  National Suicide Hotline telephone number  [facility] assessment telephone number  [facility] Emergency Assistance 911  [facility] and/or [facility] telephone number   Jeanelle MallingChelsea Lancelot Alyea, [place] 04/10/2016 12:44 PM

## 2016-04-10 NOTE — Progress Notes (Signed)
D: Patient complaining of severe anxiety, ruminating about "what brought me here" which is increasing her anxiety. Denies depression, SI/HI and AVH. In bed currently, refused to go to breakfast stating that she felt funny after taking anxiety medication. Refused psychotropic meds stating that she will not be allowed to take them [facility]. A: Medication education, support given and discussion of anxiety symptoms.  R: Patient denies SI, anxious about discharge plans.

## 2016-04-10 NOTE — BHH Group Notes (Signed)
BHH Group Notes: (Clinical Social Work)   04/10/2016      Type of Therapy:  Group Therapy   Participation Level:  Did Not Attend despite MHT prompting   Ambrose MantleMareida Grossman-Orr, LCSW 04/10/2016, 1:26 PM

## 2016-04-10 NOTE — Progress Notes (Signed)
Writer spoke with patient's mom at her request. She reports that her daughter wil be going [place] [facility] [place] once [place] and has requested bloodwork to be done here. She is requesting VDRL, HIV, tuberculosis and Hepatitis A,B and C done. Raynelle FanningJulie refused her lamital on last night because she reports that she will not be on any medication while there so she reported that she feels that she does not need to take medication while here. She did request trazadone so that she could get some rest. Support given and safety maintained on unit with 15 min checks.

## 2016-04-11 LAB — PROLACTIN: Prolactin: 103.2 ng/mL — ABNORMAL HIGH (ref 4.8–23.3)

## 2016-04-11 LAB — HIV ANTIBODY (ROUTINE TESTING W REFLEX): HIV SCREEN 4TH GENERATION: NONREACTIVE

## 2016-04-11 LAB — HEMOGLOBIN A1C
Hgb A1c MFr Bld: 4.9 % (ref 4.8–5.6)
Mean Plasma Glucose: 94 mg/dL

## 2016-04-11 LAB — RPR: RPR Ser Ql: NONREACTIVE

## 2016-04-11 MED ORDER — METHOCARBAMOL 750 MG PO TABS: 750.0000 mg | ORAL_TABLET | Freq: Three times a day (TID) | ORAL | Status: DC

## 2016-04-11 MED ORDER — LIDOCAINE 5 % EX PTCH: 2.0000 | MEDICATED_PATCH | CUTANEOUS | Status: DC

## 2016-04-11 NOTE — Progress Notes (Signed)
Adult Psychoeducational Group Note  Date:  04/11/2016 Time:  10:43 PM  Group Topic/Focus:  Wrap-Up Group:   The focus of this group is to help patients review their daily goal of treatment and discuss progress on daily workbooks.   Participation Level:  Active  Participation Quality:  Appropriate  Affect:  Appropriate  Cognitive:  Alert  Insight: Appropriate  Engagement in Group:  Engaged  Modes of Intervention:  Discussion  Additional Comments:  Patient states, "my day has been a roller-coaster". Patient's goal for today was "to think positive, but my anxiety got the best of me".  Veronica Dawson Veronica Dawson 04/11/2016, 10:43 PM

## 2016-04-11 NOTE — Progress Notes (Signed)
Recreation Therapy Notes  Date: 04/11/16 Time: 0930 Location: 300 Hall Dayroom  Group Topic: Stress Management  Goal Area(s) Addresses:  Patient will verbalize importance of using healthy stress management.  Patient will identify positive emotions associated with healthy stress management.   Intervention: Calm App  Activity :  Managing Stress Meditation.  LRT introduced the stress management technique of stress management.  LRT played a recorded meditation on stress and how to deal with it.  Patients were to follow along with the recording and focus on their breathing and concentration to participate in the technique.  Education:  Stress Management, Discharge Planning.   Education Outcome: Acknowledges edcuation/In group clarification offered/Needs additional education  Clinical Observations/Feedback: Pt did not attend group.     Caroll RancherMarjette Juliocesar Blasius, LRT/CTRS     Caroll RancherLindsay, Shataya Winkles A 04/11/2016 11:52 AM

## 2016-04-11 NOTE — B[facility] Group Notes (Signed)
B[facility] LCSW Group Therapy  04/11/2016 1:15pm  Type of Therapy:  Group Therapy vercoming Obstacles  Pt did not attend, declined invitation.    Vernie ShanksLauren Deisi Salonga, LCSW 04/11/2016 3:47 PM

## 2016-04-11 NOTE — Progress Notes (Signed)
Patient ID: Veronica FavorJulie Dawson, female   DOB: 11/08/1990, 25 y.o.   MRN: [id number]  DAR: Pt. Denies SI/HI and A/V Hallucinations. She reports sleep is good, appetite is fair, energy level is normal, and concentration is good. She rates depression, anxiety, and hopelessness 2/10 this morning. However this afternoon she reports her anxiety increased and she received PRN Vistaril. Patient does not report any pain or discomfort at this time. Support and encouragement provided to the patient. Scheduled medications offered to patient however patient refused. Patient's mother called for an update today. Patient's mother reports that [facility] will be [facility] and "God will take care of the situation. I do not want her taking medications." Patient is minimal with Clinical research associatewriter but is seen interacting with peers. Q15 minute checks are maintained for safety.

## 2016-04-11 NOTE — Progress Notes (Signed)
[facility] MD Progress Note  04/11/2016 2:40 PM Veronica Dawson  MRN:  [id number] Subjective:  Patient states, "I'm ok, I am going to [facility] (rehab program)"  Objective:  Veronica Dawson, 25 year old single female, lives with mother, works as Leisure centre manager, reports recent break up with BF Reports chronic depression that has worsened.  Admission BAL is 95.  overdosing on Lamictal and Nyquil. States " I took about 9 Lamictal and I took about half a bottle of Nyquil".  Seen today.  Flat affect.  Tolerating her medications.  Per nursing, patient can be isolative at times. She states that she feels hopeful of the rehab program she will enter post discharge.  Principal Problem: Bipolar affective disorder, current episode depressed (HCC) Diagnosis:   Patient Active Problem List   Diagnosis Date Noted  . Bipolar affective disorder, depressed, severe (HCC) [F31.4] 04/09/2016  . Alcohol dependence with uncomplicated withdrawal (HCC) [F10.230] 04/09/2016  . Bipolar affective disorder, current episode depressed (HCC) [F31.30] 04/09/2016   Total Time spent with patient: 30 minutes  Past Psychiatric History: see HPI  Past Medical History:  Past Medical History:  Diagnosis Date  . Bipolar 1 disorder (HCC)    History reviewed. No pertinent surgical history. Family History: History reviewed. No pertinent family history. Family Psychiatric  History: see HPI Social History:  History  Alcohol Use  . Yes     History  Drug use: Unknown    Social History   Social History  . Marital status: Single    Spouse name: N/A  . Number of children: N/A  . Years of education: N/A   Social History Main Topics  . Smoking status: Current Every Day Smoker  . Smokeless tobacco: Never Used  . Alcohol use Yes  . Drug use: Unknown  . Sexual activity: No   Other Topics Concern  . None   Social History Narrative  . None   Additional Social History:    Pain Medications: none Prescriptions: none Over the  Counter: none History of alcohol / drug use?: No history of alcohol / drug abuse    Sleep: Good  Appetite:  Good  Current Medications: Current Facility-Administered Medications  Medication Dose Route Frequency Provider Last Rate Last Dose  . alum & mag hydroxide-simeth (MAALOX/MYLANTA) 200-200-20 MG/5ML suspension 30 mL  30 mL Oral Q4H PRN Charm Rings, NP      . buPROPion (WELLBUTRIN XL) 24 hr tablet 150 mg  150 mg Oral Daily Rockey Situ Cobos, MD      . hydrOXYzine (ATARAX/VISTARIL) tablet 50 mg  50 mg Oral TID PRN Charm Rings, NP   50 mg at 04/10/16 2116  . lamoTRIgine (LAMICTAL) tablet 100 mg  100 mg Oral Daily Charm Rings, NP   100 mg at 04/09/16 1729  . lamoTRIgine (LAMICTAL) tablet 25 mg  25 mg Oral QHS Fernando A Cobos, MD      . lidocaine (LIDODERM) 5 % 2 patch  2 patch Transdermal Q24H Adonis Brook, NP      . magnesium hydroxide (MILK OF MAGNESIA) suspension 30 mL  30 mL Oral Daily PRN Charm Rings, NP      . methocarbamol (ROBAXIN) tablet 750 mg  750 mg Oral TID Adonis Brook, NP      . norethindrone-ethinyl estradiol (JUNEL FE,GILDESS FE,LOESTRIN FE) 1-20 MG-MCG per tablet 1 tablet  1 tablet Oral Daily Charm Rings, NP   1 tablet at 04/10/16 0900  . traZODone (DESYREL) tablet 50 mg  50 mg Oral QHS,MR X 1 Charm RingsJamison Y Lord, NP   50 mg at 04/10/16 2220  . tuberculin injection 5 Units  5 Units Intradermal Once Adonis BrookSheila Karishma Unrein, NP   5 Units at 04/10/16 1657    Lab Results:  Results for orders placed or performed during the hospital encounter of 04/09/16 (from the past 48 hour(s))  Lipid panel     Status: Abnormal   Collection Time: 04/10/16  6:24 AM  Result Value Ref Range   Cholesterol 200 0 - 200 mg/dL   Triglycerides 82 <161<150 mg/dL   HDL 64 >09>40 mg/dL   Total CHOL/HDL Ratio 3.1 RATIO   VLDL 16 0 - 40 mg/dL   LDL Cholesterol 604120 (H) 0 - 99 mg/dL    Comment:        Total Cholesterol/HDL:CHD Risk Coronary Heart Disease Risk Table                     Men   Women   1/2 Average Risk   3.4   3.3  Average Risk       5.0   4.4  2 X Average Risk   9.6   7.1  3 X Average Risk  23.4   11.0        Use the calculated Patient Ratio above and the CHD Risk Table to determine the patient's CHD Risk.        ATP III CLASSIFICATION (LDL):  <100     mg/dL   Optimal  540-981100-129  mg/dL   Near or Above                    Optimal  130-159  mg/dL   Borderline  191-478160-189  mg/dL   High  >295>190     mg/dL   Very High Performed at [facility]   Prolactin     Status: Abnormal   Collection Time: 04/10/16  6:24 AM  Result Value Ref Range   Prolactin 103.2 (H) 4.8 - 23.3 ng/mL    Comment: (NOTE) Performed At: [facility] [place] [address] [place], [place] [id number] Mila HomerHancock William F MD QI:[id number]:[phone] Performed at [facility]   Hemoglobin A1c     Status: None   Collection Time: 04/10/16  6:24 AM  Result Value Ref Range   Hgb A1c MFr Bld 4.9 4.8 - 5.6 %    Comment: (NOTE)         Pre-diabetes: 5.7 - 6.4         Diabetes: >6.4         Glycemic control for adults with diabetes: <7.0    Mean Plasma Glucose 94 mg/dL    Comment: (NOTE) Performed At: [facility] [place] [address] [place], [place] [id number] Mila HomerHancock William F MD OZ:[id number]:[phone] Performed at [facility]   TSH     Status: None   Collection Time: 04/10/16  6:24 AM  Result Value Ref Range   TSH 2.173 0.350 - 4.500 uIU/mL    Comment: Performed by a 3rd Generation assay with a functional sensitivity of <=0.01 uIU/mL. Performed at [facility]   HIV antibody     Status: None   Collection Time: 04/11/16  6:19 AM  Result Value Ref Range   HIV Screen 4th Generation wRfx Non Reactive Non Reactive    Comment: (NOTE) Performed At: [facility] [place] [address] [place], [place] [id number] Mila HomerHancock William F MD [id number]:[phone] Performed at [facility]   RPR     Status: None   Collection Time: 04/11/16  6:19 AM  Result  Value Ref Range   RPR Ser Ql Non Reactive Non Reactive    Comment: (NOTE) Performed At: [facility] [place] [address] [place], [place] [id number] Mila HomerHancock William F MD WU:[phone] Performed at [facility]     Blood Alcohol level:  Lab Results  Component Value Date   ETH 95 (H) 04/08/2016    Metabolic Disorder Labs: Lab Results  Component Value Date   HGBA1C 4.9 04/10/2016   MPG 94 04/10/2016   Lab Results  Component Value Date   PROLACTIN 103.2 (H) 04/10/2016   Lab Results  Component Value Date   CHOL 200 04/10/2016   TRIG 82 04/10/2016   HDL 64 04/10/2016   CHOLHDL 3.1 04/10/2016   VLDL 16 04/10/2016   LDLCALC 120 (H) 04/10/2016    Physical Findings: AIMS: Facial and Oral Movements Muscles of Facial Expression: None, normal Lips and Perioral Area: None, normal Jaw: None, normal Tongue: None, normal,Extremity Movements Upper (arms, wrists, hands, fingers): None, normal Lower (legs, knees, ankles, toes): None, normal, Trunk Movements Neck, shoulders, hips: None, normal, Overall Severity Severity of abnormal movements (highest score from questions above): None, normal Incapacitation due to abnormal movements: None, normal Patient's awareness of abnormal movements (rate only patient's report): No Awareness, Dental Status Current problems with teeth and/or dentures?: No Does patient usually wear dentures?: No  CIWA:  CIWA-Ar Total: 4 COWS:     Musculoskeletal: Strength & Muscle Tone: within normal limits Gait & Station: normal Patient leans: N/A  Psychiatric Specialty Exam: Physical Exam  Nursing note and vitals reviewed.   ROS  Blood pressure 113/71, pulse 90, temperature 98.2 F (36.8 C), temperature source Oral, resp. rate 16, height 5' 7.5" (1.715 m), weight 65.8 kg (145 lb).Body mass index is 22.38 kg/m.  General Appearance: Disheveled  Eye Contact:  Good  Speech:  Normal Rate  Volume:  Normal  Mood:  Depressed  Affect:   Constricted  Thought Process:  Linear  Orientation:  Full (Time, Place, and Person)  Thought Content:  WDL  Suicidal Thoughts:  No  Homicidal Thoughts:  No  Memory:  Immediate;   Fair Recent;   Fair Remote;   Fair  Judgement:  Fair  Insight:  Fair  Psychomotor Activity:  Normal  Concentration:  Concentration: Fair and Attention Span: Fair  Recall:  Fis[employer]Fair  Fund of Knowledge:  Fair  Language:  Fair  Akathisia:  Negative  Handed:  Right  AIMS (if indicated):     Assets:  Financial Resources/Insurance Physical Health Resilience  ADL's:  Intact  Cognition:  WNL  Sleep:  Number of Hours: 6.5   Treatment Plan Summary: Review of chart, vital signs, medications, and notes.  Lab results reveiwed 1-Individual and group therapy  2-Medication management for depression and anxiety: Medications reviewed with the patient and she stated no untoward effects, unchanged.  3-Coping skills for depression, anxiety  4-Continue crisis stabilization and management  5-Address health issues--monitoring vital signs, stable  6-Treatment plan in progress to prevent relapse of depression and anxiety  Lindwood QuaSheila May Shaquavia Whisonant, NP [facility] 04/11/2016, 2:40 PM

## 2016-04-11 NOTE — Progress Notes (Cosign Needed)
Adult Psychoeducational Group Note  Date:  04/11/2016 Time:  12:58 PM  Group Topic/Focus:  Coping With Mental Health Crisis:   The purpose of this group is to help patients identify strategies for coping with mental health crisis.  Group discusses possible causes of crisis and ways to manage them effectively.   Participation Level:  Active  Participation Quality:  Attentive  Affect:  Appropriate  Cognitive:  Appropriate  Insight: Appropriate, Good and Improving  Engagement in Group:  Engaged  Modes of Intervention:  Discussion  Additional Comments:Pt participated in group this morning. Pt states that she has suffered with depression and ADD most of her life.  Pt states that she hasnt taken any medication today and that she feels good. Veronica Dawson R Veronica Dawson 04/11/2016, 12:58 PM

## 2016-04-11 NOTE — Tx Team (Signed)
Interdisciplinary Treatment and Diagnostic Plan Update  04/11/2016 Time of Session: 8:58 AM  Veronica Dawson MRN: [id number]  Principal Diagnosis: Bipolar affective disorder, current episode depressed ([place])  Secondary Diagnoses: Principal Problem:   Bipolar affective disorder, current episode depressed ([place])   Current Medications:  Current Facility-Administered Medications  Medication Dose Route Frequency Provider Last Rate Last Dose  . alum & mag hydroxide-simeth (MAALOX/MYLANTA) 200-200-20 MG/5ML suspension 30 mL  30 mL Oral Q4H PRN Patrecia Pour, NP      . buPROPion (WELLBUTRIN XL) 24 hr tablet 150 mg  150 mg Oral Daily Myer Peer Cobos, MD      . hydrOXYzine (ATARAX/VISTARIL) tablet 50 mg  50 mg Oral TID PRN Patrecia Pour, NP   50 mg at 04/10/16 2116  . lamoTRIgine (LAMICTAL) tablet 100 mg  100 mg Oral Daily Patrecia Pour, NP   100 mg at 04/09/16 1729  . lamoTRIgine (LAMICTAL) tablet 25 mg  25 mg Oral QHS Fernando A Cobos, MD      . magnesium hydroxide (MILK OF MAGNESIA) suspension 30 mL  30 mL Oral Daily PRN Patrecia Pour, NP      . norethindrone-ethinyl estradiol (JUNEL FE,GILDESS FE,LOESTRIN FE) 1-20 MG-MCG per tablet 1 tablet  1 tablet Oral Daily Patrecia Pour, NP   1 tablet at 04/10/16 0900  . traZODone (DESYREL) tablet 50 mg  50 mg Oral QHS,MR X 1 Patrecia Pour, NP   50 mg at 04/10/16 2220  . tuberculin injection 5 Units  5 Units Intradermal Once Kerrie Buffalo, NP   5 Units at 04/10/16 1657    PTA Medications: Prescriptions Prior to Admission  Medication Sig Dispense Refill Last Dose  . lamoTRIgine (LAMICTAL) 100 MG tablet Take 100 mg by mouth daily.   04/08/2016 at Unknown time  . Multiple Vitamin (MULTIVITAMIN WITH MINERALS) TABS tablet Take 1 tablet by mouth daily.   Past Week at Unknown time  . norethindrone-ethinyl estradiol (JUNEL FE,GILDESS FE,LOESTRIN FE) 1-20 MG-MCG tablet Take 1 tablet by mouth daily.   Past Week at Unknown time    Treatment Modalities:  Medication Management, Group therapy, Case management,  1 to 1 session with clinician, Psychoeducation, Recreational therapy.  Patient Stressors: Financial difficulties Marital or family conflict  Patient Strengths: Ability for insight Average or above average intelligence  Physician Treatment Plan for Primary Diagnosis: Bipolar affective disorder, current episode depressed ([place]) Long Term Goal(s): Improvement in symptoms so as ready for discharge  Short Term Goals: Ability to identify changes in lifestyle to reduce recurrence of condition will improve Ability to disclose and discuss suicidal ideas Compliance with prescribed medications will improve Ability to maintain clinical measurements within normal limits will improve Compliance with prescribed medications will improve Ability to identify triggers associated with substance abuse/mental health issues will improve  Medication Management: Evaluate patient's response, side effects, and tolerance of medication regimen.  Therapeutic Interventions: 1 to 1 sessions, Unit Group sessions and Medication administration.  Evaluation of Outcomes: Not Met  Physician Treatment Plan for Secondary Diagnosis: Principal Problem:   Bipolar affective disorder, current episode depressed ([place])   Long Term Goal(s): Improvement in symptoms so as ready for discharge  Short Term Goals: Ability to identify changes in lifestyle to reduce recurrence of condition will improve Ability to disclose and discuss suicidal ideas Compliance with prescribed medications will improve Ability to maintain clinical measurements within normal limits will improve Compliance with prescribed medications will improve Ability to identify triggers associated with substance abuse/mental health  issues will improve  Medication Management: Evaluate patient's response, side effects, and tolerance of medication regimen.  Therapeutic Interventions: 1 to 1 sessions, Unit Group  sessions and Medication administration.  Evaluation of Outcomes: Not Met   RN Treatment Plan for Primary Diagnosis: Bipolar affective disorder, current episode depressed ([place]) Long Term Goal(s): Knowledge of disease and therapeutic regimen to maintain health will improve  Short Term Goals: Ability to verbalize feelings will improve, Ability to disclose and discuss suicidal ideas and Ability to identify and develop effective coping behaviors will improve  Medication Management: RN will administer medications as ordered by provider, will assess and evaluate patient's response and provide education to patient for prescribed medication. RN will report any adverse and/or side effects to prescribing provider.  Therapeutic Interventions: 1 on 1 counseling sessions, Psychoeducation, Medication administration, Evaluate responses to treatment, Monitor vital signs and CBGs as ordered, Perform/monitor CIWA, COWS, AIMS and Fall Risk screenings as ordered, Perform wound care treatments as ordered.  Evaluation of Outcomes: Not Met   LCSW Treatment Plan for Primary Diagnosis: Bipolar affective disorder, current episode depressed ([place]) Long Term Goal(s): Safe transition to appropriate next level of care at discharge, Engage patient in therapeutic group addressing interpersonal concerns.  Short Term Goals: Engage patient in aftercare planning with referrals and resources, Identify triggers associated with mental health/substance abuse issues and Increase skills for wellness and recovery  Therapeutic Interventions: Assess for all discharge needs, 1 to 1 time with Social worker, Explore available resources and support systems, Assess for adequacy in community support network, Educate family and significant other(s) on suicide prevention, Complete Psychosocial Assessment, Interpersonal group therapy.  Evaluation of Outcomes: Not Met   Progress in Treatment: Attending groups: Yes Participating in groups:  Yes Taking medication as prescribed: Yes, MD continues to assess for medication changes as needed Toleration medication: Yes, no side effects reported at this time Family/Significant other contact made: No, CSW attempting to make contact with mother Patient understands diagnosis: Continuing to assess Discussing patient identified problems/goals with staff: Yes Medical problems stabilized or resolved: Yes Denies suicidal/homicidal ideation: Yes Issues/concerns per patient self-inventory: None Other: N/A  New problem(s) identified: None identified at this time.   New Short Term/Long Term Goal(s): None identified at this time.   Discharge Plan or Barriers: CSW will assess for appropriate discharge plan and relevant barriers.   Reason for Continuation of Hospitalization: Anxiety Depression Medication stabilization Suicidal ideation Withdrawal symptoms  Estimated Length of Stay: 2-4 days  Attendees: Patient: 04/11/2016  8:58 AM  Physician: Dr. Sharolyn Douglas, Dr. Shea Evans 04/11/2016  8:58 AM  Nursing: Grayland Ormond, RN; Gaylan Gerold, RN 04/11/2016  8:58 AM  RN Care Manager: Lars Pinks, RN 04/11/2016  8:58 AM  Social Worker: Adriana Reams, LCSW; Erasmo Downer Drinkard, LCSW 04/11/2016  8:58 AM  Recreational Therapist:  04/11/2016  8:58 AM  Other: Lindell Spar, NP; Samuel Jester, NP; Ricky Ala, NP 04/11/2016  8:58 AM  Other:  04/11/2016  8:58 AM  Other: 04/11/2016  8:58 AM    Scribe for Treatment Team: Gladstone Lighter, LCSW 04/11/2016 8:58 AM

## 2016-04-12 DIAGNOSIS — Z79899 Other long term (current) drug therapy: Secondary | ICD-10-CM

## 2016-04-12 DIAGNOSIS — F10230 Alcohol dependence with withdrawal, uncomplicated: Secondary | ICD-10-CM

## 2016-04-12 DIAGNOSIS — F3130 Bipolar disorder, current episode depressed, mild or moderate severity, unspecified: Secondary | ICD-10-CM

## 2016-04-12 DIAGNOSIS — F17210 Nicotine dependence, cigarettes, uncomplicated: Secondary | ICD-10-CM

## 2016-04-12 LAB — HEPATITIS PANEL, ACUTE
HEP A IGM: NEGATIVE
Hep B C IgM: NEGATIVE
Hepatitis B Surface Ag: NEGATIVE

## 2016-04-12 NOTE — Progress Notes (Signed)
Nursing Progress Note: 7p-7a D: Pt currently presents with a anxious and depressed affect and behavior. Pt states "I am trying to not take medications. I am working on being in a teen substance program and they won't accept applicants on meds. Trazodone helps me sleep, so that is my one exception." Pt reports good sleep with current medication regimen.   A: Pt provided with medications per providers orders. Pt's labs and vitals were monitored throughout the night. Pt supported emotionally and encouraged to express concerns and questions. Pt educated on medications.  R: Pt's safety ensured with 15 minute and environmental checks. Pt currently denies SI/HI/Self Harm and A/V hallucinations. Pt verbally agrees to seek staff if SI/HI or A/VH occurs and to consult with staff before acting on any harmful thoughts. Will continue POC.

## 2016-04-12 NOTE — [facility] Group Notes (Signed)
[facility] LCSW Group Therapy 04/12/2016 1:15 PM  Type of Therapy: Group Therapy- Feelings about Diagnosis  Pt was present in group but slept throughout session and did not participate.  Vernie ShanksLauren Norvell Ureste, LCSW 04/12/2016 4:08 PM

## 2016-04-12 NOTE — Progress Notes (Signed)
D: patient is A&Ox4, denies SI/HI and AVH. Patient has been sleeping a lot today. Patient c/o anxiety, stated she's anxious about being here and anxious about being discharged. Patient is refusing medications except Vistaril and Trazodone in order to get into the teen challenge program. Patient is pleasant and cooperative but presents as sad and slightly anxious. A: Patient given medications as requested. Emotional support given as needed. Continue q15 minute checks for safety. R: Patient remains safe. Patient verbalized understanding to let staff know if she no longer feels safe with herself.  Jaiveer Panas, Wyman SongsterAngela Marie, RN

## 2016-04-12 NOTE — [facility] Group Notes (Addendum)
Pt attended spiritual care group on grief and loss facilitated by chaplain Burnis KingfisherMatthew Keiana Tavella   Group opened with brief discussion and psycho-social ed around grief and loss in relationships and in relation to self - identifying life patterns, circumstances, changes that cause losses. Established group norm of speaking from own life experience. Group goal of establishing open and affirming space for members to share loss and experience with grief, normalize grief experience and provide psycho social education and grief support.   Veronica FanningJulie was present throughout group.  SHe did not engage in group discussion.  Moved from her original chair to a seat by the window and looked out window during latter half of group.  She did not engage when facilitator opened space for group members who had not spoken.    Veronica Dawson, Veronica Dawson

## 2016-04-12 NOTE — Progress Notes (Signed)
[facility] MD Progress Note  04/12/2016 4:21 PM Veronica Dawson  MRN:  [id number] Subjective:  Patient states, "I'm ok today."  Objective:  Veronica Dawson, 25 year old single female, lives with mother, works as Leisure centre manager, reports recent break up with BF Reports chronic depression that has worsened.  Admission BAL is 95.  overdosing on Lamictal and Nyquil. States " I took about 9 Lamictal and I took about half a bottle of Nyquil".  Seen today.  Still somewhat flat affect.  Did attend groups per nursing but also isolating in room  Tolerating her medications.  She states that she feels anxious yet hopeful of the rehab program she will enter post discharge.  Principal Problem: Bipolar affective disorder, current episode depressed (HCC) Diagnosis:   Patient Active Problem List   Diagnosis Date Noted  . Bipolar affective disorder, depressed, severe (HCC) [F31.4] 04/09/2016  . Alcohol dependence with uncomplicated withdrawal (HCC) [F10.230] 04/09/2016  . Bipolar affective disorder, current episode depressed (HCC) [F31.30] 04/09/2016   Total Time spent with patient: 30 minutes  Past Psychiatric History: see HPI  Past Medical History:  Past Medical History:  Diagnosis Date  . Bipolar 1 disorder (HCC)    History reviewed. No pertinent surgical history. Family History: History reviewed. No pertinent family history. Family Psychiatric  History: see HPI Social History:  History  Alcohol Use  . Yes     History  Drug use: Unknown    Social History   Social History  . Marital status: Single    Spouse name: N/A  . Number of children: N/A  . Years of education: N/A   Social History Main Topics  . Smoking status: Current Every Day Smoker  . Smokeless tobacco: Never Used  . Alcohol use Yes  . Drug use: Unknown  . Sexual activity: No   Other Topics Concern  . None   Social History Narrative  . None   Additional Social History:    Pain Medications: none Prescriptions: none Over the  Counter: none History of alcohol / drug use?: No history of alcohol / drug abuse    Sleep: Good  Appetite:  Good  Current Medications: Current Facility-Administered Medications  Medication Dose Route Frequency Provider Last Rate Last Dose  . alum & mag hydroxide-simeth (MAALOX/MYLANTA) 200-200-20 MG/5ML suspension 30 mL  30 mL Oral Q4H PRN Charm Rings, NP      . buPROPion (WELLBUTRIN XL) 24 hr tablet 150 mg  150 mg Oral Daily Rockey Situ Kassadee Carawan, MD      . hydrOXYzine (ATARAX/VISTARIL) tablet 50 mg  50 mg Oral TID PRN Charm Rings, NP   50 mg at 04/12/16 1214  . lamoTRIgine (LAMICTAL) tablet 100 mg  100 mg Oral Daily Charm Rings, NP   100 mg at 04/09/16 1729  . lamoTRIgine (LAMICTAL) tablet 25 mg  25 mg Oral QHS Lorenzo Arscott A Shalyn Koral, MD      . magnesium hydroxide (MILK OF MAGNESIA) suspension 30 mL  30 mL Oral Daily PRN Charm Rings, NP      . norethindrone-ethinyl estradiol (JUNEL FE,GILDESS FE,LOESTRIN FE) 1-20 MG-MCG per tablet 1 tablet  1 tablet Oral Daily Charm Rings, NP   1 tablet at 04/10/16 0900  . traZODone (DESYREL) tablet 50 mg  50 mg Oral QHS,MR X 1 Charm Rings, NP   50 mg at 04/11/16 2141  . tuberculin injection 5 Units  5 Units Intradermal Once Adonis Brook, NP   5 Units at 04/10/16 1657  Lab Results:  Results for orders placed or performed during the hospital encounter of 04/09/16 (from the past 48 hour(s))  HIV antibody     Status: None   Collection Time: 04/11/16  6:19 AM  Result Value Ref Range   HIV Screen 4th Generation wRfx Non Reactive Non Reactive    Comment: (NOTE) Performed At: [facility] [address] [place], [place] [id number] Mila HomerHancock William F MD [id number]:[phone] Performed at [facility]   Hepatitis panel, acute     Status: None   Collection Time: 04/11/16  6:19 AM  Result Value Ref Range   Hepatitis B Surface Ag Negative Negative   HCV Ab <0.1 0.0 - 0.9 s/co ratio    Comment: (NOTE)                                   Negative:     < 0.8                             Indeterminate: 0.8 - 0.9                                  Positive:     > 0.9 The CDC recommends that a positive HCV antibody result be followed up with a HCV Nucleic Acid Amplification test ([id number]). Performed At: [facility] [place] [address] [place], [place] [id number] Mila HomerHancock William F MD [id number]:[phone]    Hep A IgM Negative Negative   Hep B C IgM Negative Negative    Comment: Performed at [facility]  RPR     Status: None   Collection Time: 04/11/16  6:19 AM  Result Value Ref Range   RPR Ser Ql Non Reactive Non Reactive    Comment: (NOTE) Performed At: [facility]  [address] [place], [place] [id number] Mila HomerHancock William F MD UY:[id number]:[phone] Performed at [facility]     Blood Alcohol level:  Lab Results  Component Value Date   ETH 95 (H) 04/08/2016    Metabolic Disorder Labs: Lab Results  Component Value Date   HGBA1C 4.9 04/10/2016   MPG 94 04/10/2016   Lab Results  Component Value Date   PROLACTIN 103.2 (H) 04/10/2016   Lab Results  Component Value Date   CHOL 200 04/10/2016   TRIG 82 04/10/2016   HDL 64 04/10/2016   CHOLHDL 3.1 04/10/2016   VLDL 16 04/10/2016   LDLCALC 120 (H) 04/10/2016    Physical Findings: AIMS: Facial and Oral Movements Muscles of Facial Expression: None, normal Lips and Perioral Area: None, normal Jaw: None, normal Tongue: None, normal,Extremity Movements Upper (arms, wrists, hands, fingers): None, normal Lower (legs, knees, ankles, toes): None, normal, Trunk Movements Neck, shoulders, hips: None, normal, Overall Severity Severity of abnormal movements (highest score from questions above): None, normal Incapacitation due to abnormal movements: None, normal Patient's awareness of abnormal movements (rate only patient's report): No Awareness, Dental Status Current problems with teeth and/or dentures?: No Does patient usually  wear dentures?: No  CIWA:  CIWA-Ar Total: 4 COWS:     Musculoskeletal: Strength & Muscle Tone: within normal limits Gait & Station: normal Patient leans: N/A  Psychiatric Specialty Exam: Physical Exam  Nursing note and vitals reviewed.   ROS  Blood pressure 114/80, pulse 89, temperature 98.3  F (36.8 C), temperature source Oral, resp. rate 18, height 5' 7.5" (1.715 m), weight 65.8 kg (145 lb).Body mass index is 22.38 kg/m.  General Appearance: Disheveled  Eye Contact:  Good  Speech:  Normal Rate  Volume:  Normal  Mood:  Depressed  Affect:  Constricted  Thought Process:  Linear  Orientation:  Full (Time, Place, and Person)  Thought Content:  WDL  Suicidal Thoughts:  No  Homicidal Thoughts:  No  Memory:  Immediate;   Fair Recent;   Fair Remote;   Fair  Judgement:  Fair  Insight:  Fair  Psychomotor Activity:  Normal  Concentration:  Concentration: Fair and Attention Span: Fair  Recall:  Fis[employer]Fair  Fund of Knowledge:  Fair  Language:  Fair  Akathisia:  Negative  Handed:  Right  AIMS (if indicated):     Assets:  Financial Resources/Insurance Physical Health Resilience  ADL's:  Intact  Cognition:  WNL  Sleep:  Number of Hours: 6.5   Treatment Plan Summary: Review of chart, vital signs, medications, and notes.  Lab results reveiwed 1-Individual and group therapy  2-Medication management for depression and anxiety: Medications reviewed with the patient and she stated no untoward effects, unchanged.  3-Coping skills for depression, anxiety  4-Continue crisis stabilization and management  5-Address health issues--monitoring vital signs, stable  6-Treatment plan in progress to prevent relapse of depression and anxiety  Lindwood QuaSheila May Agustin, NP [facility] 04/12/2016, 4:21 PM   Agree with NP Progress Note as above

## 2016-04-12 NOTE — [facility] Group Notes (Signed)
[facility] Group Notes:  (Nursing/MHT/Case Management/Adjunct)  Date:  04/12/2016  Time:  0900 am  Type of Therapy:  Psychoeducational Skills  Participation Level:  Did Not Attend  Patient invited; declined to attend.  Veronica Dawson 04/12/2016, 10:26 AM 

## 2016-04-12 NOTE — Progress Notes (Signed)
  [facility] :  Will you be returning to the same living situation after discharge:  No. Pt is going to [facility] in [place] continued treatment At discharge, do you have transportation home?: Yes,  Pt father to pick up Do you have the ability to pay for your medications: Yes,  Pt provided with prescriptions  Release of information consent forms completed and in the chart;  Patient's signature needed at discharge.  Patient to Follow up at: Follow-up Information    [facility] Follow up.   Why:  Please present the day of discharge or the day following to continue your substance abuse treatment at this program.  Contact information: [address] [place], [place] [id number] [phone]          Next level of care provider has [facility] to [facility] Link:no  Safety Planning and Suicide Prevention discussed: Yes,  with Pt; declined SPE with family  Have you used any form of tobacco in the last 30 days? (Cigarettes, Smokeless Tobacco, Cigars, and/or Pipes): No  Has patient been referred to the Quitline?: N/A patient is not a smoker  Patient has been referred for addiction treatment: Yes  Veronica Dawson 04/12/2016, 4:15 PM

## 2016-04-12 NOTE — Tx Team (Signed)
Interdisciplinary Treatment and Diagnostic Plan Update  04/12/2016 Time of Session: 4:08 PM  Veronica FavorJulie Dawson MRN: [id number]  Principal Diagnosis: Bipolar affective disorder, current episode depressed (HCC)  Secondary Diagnoses: Principal Problem:   Bipolar affective disorder, current episode depressed (HCC)   Current Medications:  Current Facility-Administered Medications  Medication Dose Route Frequency Provider Last Rate Last Dose  . alum & mag hydroxide-simeth (MAALOX/MYLANTA) 200-200-20 MG/5ML suspension 30 mL  30 mL Oral Q4H PRN Charm RingsJamison Y Lord, NP      . buPROPion (WELLBUTRIN XL) 24 hr tablet 150 mg  150 mg Oral Daily Rockey SituFernando A Cobos, MD      . hydrOXYzine (ATARAX/VISTARIL) tablet 50 mg  50 mg Oral TID PRN Charm RingsJamison Y Lord, NP   50 mg at 04/12/16 1214  . lamoTRIgine (LAMICTAL) tablet 100 mg  100 mg Oral Daily Charm RingsJamison Y Lord, NP   100 mg at 04/09/16 1729  . lamoTRIgine (LAMICTAL) tablet 25 mg  25 mg Oral QHS Fernando A Cobos, MD      . magnesium hydroxide (MILK OF MAGNESIA) suspension 30 mL  30 mL Oral Daily PRN Charm RingsJamison Y Lord, NP      . norethindrone-ethinyl estradiol (JUNEL FE,GILDESS FE,LOESTRIN FE) 1-20 MG-MCG per tablet 1 tablet  1 tablet Oral Daily Charm RingsJamison Y Lord, NP   1 tablet at 04/10/16 0900  . traZODone (DESYREL) tablet 50 mg  50 mg Oral QHS,MR X 1 Charm RingsJamison Y Lord, NP   50 mg at 04/11/16 2141  . tuberculin injection 5 Units  5 Units Intradermal Once Adonis BrookSheila Agustin, NP   5 Units at 04/10/16 1657    PTA Medications: Prescriptions Prior to Admission  Medication Sig Dispense Refill Last Dose  . lamoTRIgine (LAMICTAL) 100 MG tablet Take 100 mg by mouth daily.   04/08/2016 at Unknown time  . Multiple Vitamin (MULTIVITAMIN WITH MINERALS) TABS tablet Take 1 tablet by mouth daily.   Past Week at Unknown time  . norethindrone-ethinyl estradiol (JUNEL FE,GILDESS FE,LOESTRIN FE) 1-20 MG-MCG tablet Take 1 tablet by mouth daily.   Past Week at Unknown time    Treatment Modalities:  Medication Management, Group therapy, Case management,  1 to 1 session with clinician, Psychoeducation, Recreational therapy.  Patient Stressors: Financial difficulties Marital or family conflict  Patient Strengths: Ability for insight Average or above average intelligence  Physician Treatment Plan for Primary Diagnosis: Bipolar affective disorder, current episode depressed (HCC) Long Term Goal(s): Improvement in symptoms so as ready for discharge  Short Term Goals: Ability to identify changes in lifestyle to reduce recurrence of condition will improve Ability to disclose and discuss suicidal ideas Compliance with prescribed medications will improve Ability to maintain clinical measurements within normal limits will improve Compliance with prescribed medications will improve Ability to identify triggers associated with substance abuse/mental health issues will improve  Medication Management: Evaluate patient's response, side effects, and tolerance of medication regimen.  Therapeutic Interventions: 1 to 1 sessions, Unit Group sessions and Medication administration.  Evaluation of Outcomes: Adequate for Discharge  Physician Treatment Plan for Secondary Diagnosis: Principal Problem:   Bipolar affective disorder, current episode depressed (HCC)   Long Term Goal(s): Improvement in symptoms so as ready for discharge  Short Term Goals: Ability to identify changes in lifestyle to reduce recurrence of condition will improve Ability to disclose and discuss suicidal ideas Compliance with prescribed medications will improve Ability to maintain clinical measurements within normal limits will improve Compliance with prescribed medications will improve Ability to identify triggers associated with substance abuse/mental  health issues will improve  Medication Management: Evaluate patient's response, side effects, and tolerance of medication regimen.  Therapeutic Interventions: 1 to 1 sessions,  Unit Group sessions and Medication administration.  Evaluation of Outcomes: Adequate for Discharge   RN Treatment Plan for Primary Diagnosis: Bipolar affective disorder, current episode depressed (HCC) Long Term Goal(s): Knowledge of disease and therapeutic regimen to maintain health will improve  Short Term Goals: Ability to verbalize feelings will improve, Ability to disclose and discuss suicidal ideas and Ability to identify and develop effective coping behaviors will improve  Medication Management: RN will administer medications as ordered by provider, will assess and evaluate patient's response and provide education to patient for prescribed medication. RN will report any adverse and/or side effects to prescribing provider.  Therapeutic Interventions: 1 on 1 counseling sessions, Psychoeducation, Medication administration, Evaluate responses to treatment, Monitor vital signs and CBGs as ordered, Perform/monitor CIWA, COWS, AIMS and Fall Risk screenings as ordered, Perform wound care treatments as ordered.  Evaluation of Outcomes: Adequate for Discharge   LCSW Treatment Plan for Primary Diagnosis: Bipolar affective disorder, current episode depressed (HCC) Long Term Goal(s): Safe transition to appropriate next level of care at discharge, Engage patient in therapeutic group addressing interpersonal concerns.  Short Term Goals: Engage patient in aftercare planning with referrals and resources, Identify triggers associated with mental health/substance abuse issues and Increase skills for wellness and recovery  Therapeutic Interventions: Assess for all discharge needs, 1 to 1 time with Social worker, Explore available resources and support systems, Assess for adequacy in community support network, Educate family and significant other(s) on suicide prevention, Complete Psychosocial Assessment, Interpersonal group therapy.  Evaluation of Outcomes: Adequate for Discharge   Progress in  Treatment: Attending groups: Yes Participating in groups: Yes Taking medication as prescribed: Yes, MD continues to assess for medication changes as needed Toleration medication: Yes, no side effects reported at this time Family/Significant other contact made: Yes with mother regarding timing for discharge Patient understands diagnosis: Developing insight Discussing patient identified problems/goals with staff: Yes Medical problems stabilized or resolved: Yes Denies suicidal/homicidal ideation: Yes Issues/concerns per patient self-inventory: None Other: N/A  New problem(s) identified: None identified at this time.   New Short Term/Long Term Goal(s): None identified at this time.   Discharge Plan or Barriers: Pt is discharging to [facility] in [place] for continued treatment   Reason for Continuation of Hospitalization: None identified at this time.   Estimated Length of Stay: 1 day  Attendees: Patient: 04/12/2016  4:08 PM  Physician: Dr. Jama Flavorsobos 04/12/2016  4:08 PM  Nursing: Haze BoydenAngela Traxler, Joslyn Devonaroline Beaudry, RN 04/12/2016  4:08 PM  RN Care Manager: Onnie BoerJennifer Clark, RN 04/12/2016  4:08 PM  Social Worker: Vernie ShanksLauren Psalm Arman, LCSW; Belenda CruiseKristin Drinkard, LCSW 04/12/2016  4:08 PM  Recreational Therapist:  04/12/2016  4:08 PM  Other: Armandina StammerAgnes Nwoko, NP; Gray BernhardtMay Augustin, NP; Claudette Headonrad Withrow, NP 04/12/2016  4:08 PM  Other:  04/12/2016  4:08 PM  Other: 04/12/2016  4:08 PM    Scribe for Treatment Team: Verdene LennertLauren C Janya Eveland, LCSW 04/12/2016 4:08 PM

## 2016-04-12 NOTE — Plan of Care (Signed)
Problem: Coping: Goal: Ability to cope will improve Outcome: Progressing Pt states she is working on Pharmacologistcoping skills

## 2016-04-12 NOTE — Progress Notes (Addendum)
PPD read at 1719. Result is negative, no redness, no swelling. Results faxed to Lilly CoveRachel McKinnon at [phone] per patient and patient's mother's request.  Charls Custer, Wyman SongsterAngela Marie, RN

## 2016-04-13 MED ORDER — HYDROXYZINE HCL 50 MG PO TABS: 50.0000 mg | ORAL_TABLET | Freq: Three times a day (TID) | ORAL | 0 refills | Status: AC | PRN

## 2016-04-13 MED ORDER — BUPROPION HCL ER (XL) 150 MG PO TB24: 150.0000 mg | ORAL_TABLET | Freq: Every day | ORAL | 0 refills | Status: AC

## 2016-04-13 MED ORDER — TRAZODONE HCL 50 MG PO TABS: 50.0000 mg | ORAL_TABLET | Freq: Every evening | ORAL | 0 refills | Status: AC | PRN

## 2016-04-13 MED ORDER — NORETHIN ACE-ETH ESTRAD-FE 1-20 MG-MCG PO TABS: 1.0000 | ORAL_TABLET | Freq: Every day | ORAL | 11 refills | Status: AC

## 2016-04-13 MED ORDER — LAMOTRIGINE 100 MG PO TABS: 100.0000 mg | ORAL_TABLET | Freq: Every day | ORAL | 0 refills | Status: AC

## 2016-04-13 MED ORDER — LAMOTRIGINE 25 MG PO TABS: 25.0000 mg | ORAL_TABLET | Freq: Every day | ORAL | 0 refills | Status: AC

## 2016-04-13 NOTE — [facility] Suicide Risk Assessment (Addendum)
[facility]   Principal Problem: Bipolar affective disorder, current episode depressed [facility](HCC) Discharge Diagnoses:  Patient Active Problem List   Diagnosis Date Noted  . Bipolar affective disorder, depressed, severe (HCC) [F31.4] 04/09/2016  . Alcohol dependence with uncomplicated withdrawal (HCC) [F10.230] 04/09/2016  . Bipolar affective disorder, current episode depressed (HCC) [F31.30] 04/09/2016    Total Time spent with patient: 30 minutes  Musculoskeletal: Strength & Muscle Tone: within normal limits Gait & Station: normal Patient leans: N/A  Psychiatric Specialty Exam: ROS no headache, no nausea, no vomiting , no rash Of note , denies lactation or amenorrhea ( has elevated prolactin)   Blood pressure 114/80, pulse 89, temperature 98.3 F (36.8 C), temperature source Oral, resp. rate 18, height 5' 7.5" (1.715 m), weight 145 lb (65.8 kg).Body mass index is 22.38 kg/m.  General Appearance: partially improved grooming   Eye Contact::  Good  Speech:  Normal Rate409  Volume:  Normal  Mood:  reports her mood as improved, less depressed   Affect:  More reactive, smiles at times appropriately, vaguely anxious  Thought Process:  Linear  Orientation:  Full (Time, Place, and Person)  Thought Content:  denies hallucinations, no delusions,not internally preoccupied   Suicidal Thoughts:  No denies any suicidal or self injurious ideations, denies any homicidal or violent ideations  Homicidal Thoughts:  No  Memory:  recent and remote grossly intact   Judgement:  Other:  improving   Insight:  fair  Psychomotor Activity:  Normal- no tremors, no diaphoresis, , no restlessness,no abnormal involuntary movements noted   Concentration:  Good  Recall:  Good  Fund of Knowledge:Good  Language: Good  Akathisia:  Negative  Handed:  Right  AIMS (if indicated):     Assets:  Desire for Improvement Resilience Social Support  Sleep:  Number of Hours: 5.75  Cognition:  WNL  ADL's:  Intact   Mental Status Per Nursing Assessment::   On Admission:  Suicidal ideation indicated by patient  Demographic Factors:  25 year old single female   Loss Factors: Relationship stressors, break up  Historical Factors: One prior psychiatric admission at 1917, history of suicide attempt by overdosing and of self cutting, history of depression  Risk Reduction Factors:   Sense of responsibility to family, Positive social support and Positive coping skills or problem solving skills  Continued Clinical Symptoms:  Patient presents with improvement compared to admission She is fully alert, attentive, well related, mood improved, with a more reactive affect, although reports anxiety related to upcoming admission to [facility]. Not tearful, smiles at times appropriately. No thought disorder, no suicidal ideations, no self injurious ideations, no hallucinations, no delusions, not internally preoccupied, no delusions expressed. Ox3. At this time future oriented, states that although anxious, she feels motivated in going to [facility], and is optimistic that this will help her.  *With patient's express consent I have spoken with her mother via phone- mother corroborates that patient is better and agrees with her being discharged today. Mother states that patient will be  with father, who will drive her down to [place] to enter [facility] program there . Patient has declined to take standing psychiatric medications and has reported that she cannot be on any psychiatric medications at [facility]. I have reviewed this with patient and mother, who states that patient has tried multiple medications in the past, and in general has not responded well to them. Patient's sister had significant psychiatric issues, and these improved dramatically  since she went to [facility] , so family and patient are invested in [place] trying this program/treatment modality. It is a  residential program with close supervision from staff . Mother also reiterated that there is a strong history of alcohol dependence in family ,and that alcohol abuse may likely be playing significant role in patient's symptoms.  * I have reviewed, with patient and with her mother, potential increased risks of psychiatric/mood decompensation without psychopharmacological management .  * I have reviewed elevated Prolactin ( 103.2)  finding with patient and patient's mother - of note , patient denies any amenorrhea or galactorrhea. 11/17 Urine Pregnancy Test negative . Have reviewed recommendation of following up within the next 2-3 weeks with PCP and/ or ObGyn to repeat /work up as necessary. They expressed understanding.  Cognitive Features That Contribute To Risk:  No gross cognitive deficits noted upon discharge. Is alert , attentive, and oriented x 3   Suicide Risk:  Mild:  Suicidal ideation of limited frequency, intensity, duration, and specificity.  There are no identifiable plans, no associated intent, mild dysphoria and related symptoms, good self-control (both objective and subjective assessment), few other risk factors, and identifiable protective factors, including available and accessible social support.  Follow-up Information    [facility] Follow up.   Why:  Please present the day of discharge or the day following to continue your substance abuse treatment at this program.  Contact information: [address] [place], [place] [id number] [phone]          Plan Of Care/Follow-up recommendations:  Activity:  as tolerated  Diet:  Regular Tests:  NA Other:  See below Patient requesting discharge and no current grounds for involuntary commitment Father will be picking her up later today Follow up as above  Patient to follow up with PCP or Ob Gyn within 3-4 weeks to follow up on elevated serum Prolactin finding .  Nehemiah MassedOBOS, Kamren Heskett, MD 04/13/2016, 10:44 AM

## 2016-04-13 NOTE — Progress Notes (Signed)
Recreation Therapy Notes  Date: 04/13/16 Time: 0930 Location: 300 Hall Dayroom  Group Topic: Stress Management  Goal Area(s) Addresses:  Patient will verbalize importance of using healthy stress management.  Patient will identify positive emotions associated with healthy stress management.   Intervention: Calm App  Activity :  Happiness Meditation.  LRT introduced the stress management technique of meditation.  LRT played a meditation on happiness to allow patients to engage in the technique.  Patients were to follow along and focus on the meditation to experience the benefits of meditation.  Education:  Stress Management, Discharge Planning.   Education Outcome: Acknowledges edcuation/In group clarification offered/Needs additional education  Clinical Observations/Feedback: Pt did not attend group.     Caroll RancherMarjette Charley Miske, LRT/CTRS         Caroll RancherLindsay, Naydeen Speirs A 04/13/2016 12:10 PM

## 2016-04-13 NOTE — Progress Notes (Signed)
  [facility] Adult Case Management Discharge Plan :  Will you be returning to the same living situation after discharge:  No. [place] live w sister in [place] before [place] [facility] [place] At discharge, do you have transportation home?: Yes,  father Do you have the ability to pay for your medications: Yes,  has insurance, no concerns expressed  Release of information consent forms completed and in the chart;  Patient's signature needed at discharge.  Patient to Follow up at: Follow-up Information    [facility] Follow up.   Why:  Please present the day of discharge or the day following to continue your substance abuse treatment at this program.  Contact information: [address] [place], [place] [id number] [phone]          Next level of care provider [facility]:no  Safety Planning and Suicide Prevention discussed: Yes,  reviewed w patient, MD also spoke w mother and reviewed concerns w mother in patient's presence  Have you used any form of tobacco in the last 30 days? (Cigarettes, Smokeless Tobacco, Cigars, and/or Pipes): No  Has patient been referred to the Quitline?: N/A patient is not a smoker  Patient has been referred for addiction treatment: Yes  Veronica Dawson 04/13/2016, 11:02 AM

## 2016-04-13 NOTE — Discharge Summary (Signed)
Physician Discharge Summary Note  Patient:  Veronica Dawson is an 25 y.o., female MRN:  [id number] DOB:  06/09/1990 Patient phone:  [phone] (home)  Patient address[place]   [address] [place] [address],  Total Time spent with patient: 30 minutes  Date of Admission:  04/09/2016 Date of Discharge:  04/13/2016  Reason for Admission:    Principal Problem: Affective psychosis, bipolar [facility](HCC) Discharge Diagnoses: Patient Active Problem List   Diagnosis Date Noted  . Bipolar affective disorder, depressed, severe (HCC) [F31.4] 04/09/2016  . Alcohol dependence with uncomplicated withdrawal (HCC) [F10.230] 04/09/2016  . 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 Bipolar 1 disorder (HCC)    History reviewed. No pertinent surgical history. Family History: History reviewed. No pertinent family history. Family Psychiatric  History:  See HPI Social History:  History  Alcohol Use  . Yes     History  Drug use: Unknown    Social History   Social History  . Marital status: Single    Spouse name: N/A  . Number of children: N/A  . Years of education: N/A   Social History Main Topics  . Smoking status: Current Every Day Smoker  . Smokeless tobacco: Never Used  . Alcohol use Yes  . Drug use: Unknown  . Sexual activity: No   Other Topics Concern  . None   Social History Narrative  . None    Hospital Course:   Veronica FavorJulie Cullins is an 25 y.o. female that presented under IVC with thoughts of self harm attempting to overdose on prescribed medication (Lamictal dosage/amount unknown) and Nyquil. Patient stated she has been "very depressed lately" and after finding out her partner of two years had been involved in another relationship "pushed her over the edge."  Veronica FavorJulie Alvelo was admitted for Affective psychosis, bipolar (HCC) and crisis management.  Patient was treated  with medications with their indications listed below in detail under Medication List.  Medical problems were identified and treated as needed.  Home medications were restarted as appropriate.  Improvement was monitored by observation and Veronica FavorJulie Varghese daily report of symptom reduction.  Emotional and mental status was monitored by daily self inventory reports completed by Veronica FavorJulie Pescador and clinical staff.  Patient reported continued improvement, denied any new concerns.  Patient had been compliant on medications and denied side effects.  Support and encouragement was provided.    Patient encouraged to attend groups to help with recognizing triggers of emotional crises and de-stabilizations.  Patient encouraged to attend group to help identify the positive things in life that would help in dealing with feelings of loss, depression and unhealthy or abusive tendencies.         Veronica FavorJulie Fuhs was evaluated by the treatment team for stability and plans for continued recovery upon discharge.  Patient was offered further treatment options upon discharge including Residential, Intensive Outpatient and Outpatient treatment. Patient will follow up with agency listed below for medication management and counseling.  Encouraged patient to maintain satisfactory support network and home environment.  Advised to adhere to medication compliance and outpatient treatment follow up.  Prescriptions provided.       Veronica FavorJulie Hovis motivation was an integral factor for scheduling further treatment.  Employment, transportation, bed availability, health status, family support, and any pending legal issues were also considered during patient's hospital stay.  Upon completion of this admission the patient was  both mentally and medically stable for discharge denying suicidal/homicidal ideation, auditory/visual/tactile hallucinations, delusional thoughts and paranoia.      Physical Findings: AIMS: Facial and Oral Movements Muscles of  Facial Expression: None, normal Lips and Perioral Area: None, normal Jaw: None, normal Tongue: None, normal,Extremity Movements Upper (arms, wrists, hands, fingers): None, normal Lower (legs, knees, ankles, toes): None, normal, Trunk Movements Neck, shoulders, hips: None, normal, Overall Severity Severity of abnormal movements (highest score from questions above): None, normal Incapacitation due to abnormal movements: None, normal Patient's awareness of abnormal movements (rate only patient's report): No Awareness, Dental Status Current problems with teeth and/or dentures?: No Does patient usually wear dentures?: No  CIWA:  CIWA-Ar Total: 4 COWS:     Musculoskeletal: Strength & Muscle Tone: within normal limits Gait & Station: normal Patient leans: N/A  Psychiatric Specialty Exam: Physical Exam  Nursing note and vitals reviewed. Psychiatric: She has a normal mood and affect. Her speech is normal and behavior is normal. Judgment and thought content normal. Cognition and memory are normal.    ROS  Blood pressure 114/80, pulse 89, temperature 98.3 F (36.8 C), temperature source Oral, resp. rate 18, height 5' 7.5" (1.715 m), weight 65.8 kg (145 lb).Body mass index is 22.38 kg/m.    Have you used any form of tobacco in the last 30 days? (Cigarettes, Smokeless Tobacco, Cigars, and/or Pipes): No  Has this patient used any form of tobacco in the last 30 days? (Cigarettes, Smokeless Tobacco, Cigars, and/or Pipes) Yes, N/A  Blood Alcohol level:  Lab Results  Component Value Date   ETH 95 (H) 04/08/2016    Metabolic Disorder Labs:  Lab Results  Component Value Date   HGBA1C 4.9 04/10/2016   MPG 94 04/10/2016   Lab Results  Component Value Date   PROLACTIN 103.2 (H) 04/10/2016   Lab Results  Component Value Date   CHOL 200 04/10/2016   TRIG 82 04/10/2016   HDL 64 04/10/2016   CHOLHDL 3.1 04/10/2016   VLDL 16 04/10/2016   LDLCALC 120 (H) 04/10/2016    See Psychiatric  Specialty Exam and Suicide Risk Assessment completed by Attending Physician prior to discharge.  Discharge destination:  Home  Is patient on multiple antipsychotic therapies at discharge:  No   Has Patient had three or more failed trials of antipsychotic monotherapy by history:  No  Recommended Plan for Multiple Antipsychotic Therapies: NA     Medication List    STOP taking these medications   multivitamin with minerals Tabs tablet     TAKE these medications     Indication  buPROPion 150 MG 24 hr tablet Commonly known as:  WELLBUTRIN XL Take 1 tablet (150 mg total) by mouth daily.  Indication:  Major Depressive Disorder   hydrOXYzine 50 MG tablet Commonly known as:  ATARAX/VISTARIL Take 1 tablet (50 mg total) by mouth 3 (three) times daily as needed for anxiety.  Indication:  Anxiety Neurosis   lamoTRIgine 100 MG tablet Commonly known as:  LAMICTAL Take 1 tablet (100 mg total) by mouth daily. What changed:  Another medication with the same name was added. Make sure you understand how and when to take each.  Indication:  mood stabilization   lamoTRIgine 25 MG tablet Commonly known as:  LAMICTAL Take 1 tablet (25 mg total) by mouth at bedtime. What changed:  You were already taking a medication with the same name, and this prescription was added. Make sure you understand how and when to take each.  Indication:  mood stabilization   norethindrone-ethinyl estradiol 1-20 MG-MCG tablet Commonly known as:  JUNEL FE,GILDESS FE,LOESTRIN FE Take 1 tablet by mouth daily.  Indication:  Birth Control   traZODone 50 MG tablet Commonly known as:  DESYREL Take 1 tablet (50 mg total) by mouth at bedtime and may repeat dose one time if needed.  Indication:  Trouble Sleeping      Follow-up Information    [facility] Follow up.   Why:  Please present the day of discharge or the day following to continue your substance abuse treatment at this program.  Contact  information: [address] [place], [place] [id number] [phone]          Follow-up recommendations:  Activity:  as tol Diet:  as tol  Comments:  1.  Take all your medications as prescribed.   2.  Report any adverse side effects to outpatient provider. 3.  Patient instructed to not use alcohol or illegal drugs while on prescription medicines. 4.  In the event of worsening symptoms, instructed patient to call 911, the crisis hotline or go to nearest emergency room for evaluation of symptoms.  Signed: Lindwood QuaSheila May Agustin, NP [facility] 04/13/2016, 11:27 AM   Patient seen, Suicide Assessment Completed.  Disposition Plan Reviewed

## 2016-04-13 NOTE — Progress Notes (Signed)
Pt discharged home with her parents. Pt was ambulatory, stable and appreciative at that time. All papers and prescriptions were given and valuables returned. Verbal understanding expressed. Denies SI/HI and A/VH. Pt given opportunity to express concerns and ask questions.  

## 2016-04-13 NOTE — Progress Notes (Signed)
Nursing Progress Note: 7p-7a D: Pt currently presents with a anxious/depressed affect and behavior. Pt reports to Clinical research associatewriter that their goal is to "to interact more with my peers work towards." Pt states "I am going to need a week supply of vistaril and trazodone. I have lost my social security and license and I can't get my meds." Pt reports good sleep with current medication regimen.   A: Pt provided with medications per providers orders. Pt's labs and vitals were monitored throughout the night. Pt supported emotionally and encouraged to express concerns and questions. Pt educated on medications.  R: Pt's safety ensured with 15 minute and environmental checks. Pt currently denies SI/HI/Self Harm and A/V hallucinations. Pt verbally agrees to seek staff if SI/HI or A/VH occurs and to consult with staff before acting on any harmful thoughts. Will continue POC.

## 2016-04-13 NOTE — Progress Notes (Signed)
Adult Psychoeducational Group Note  Date:  04/13/2016 Time:  4:44 AM  Group Topic/Focus:  Wrap-Up Group:   The focus of this group is to help patients review their daily goal of treatment and discuss progress on daily workbooks.   Participation Level:  Active  Participation Quality:  Appropriate  Affect:  Appropriate  Cognitive:  Alert  Insight: Appropriate  Engagement in Group:  Engaged  Modes of Intervention:  Discussion  Additional Comments:  Patient states, "my day was alright". Patient's goal for today was to not have anxiety. Patient did not meet goal. Termaine Roupp L Emmaus Brandi 04/13/2016, 4:44 AM
